# Patient Record
Sex: Female | Born: 1960 | Race: White | Hispanic: No | Smoking: Never smoker
Health system: Southern US, Community
[De-identification: ages and names within clinical notes are randomized; demographics above are authoritative.]

## PROBLEM LIST (undated history)

## (undated) DIAGNOSIS — F419 Anxiety disorder, unspecified: Secondary | ICD-10-CM

## (undated) DIAGNOSIS — K219 Gastro-esophageal reflux disease without esophagitis: Secondary | ICD-10-CM

## (undated) DIAGNOSIS — K2270 Barrett's esophagus without dysplasia: Secondary | ICD-10-CM

## (undated) DIAGNOSIS — G8929 Other chronic pain: Secondary | ICD-10-CM

## (undated) DIAGNOSIS — M1990 Unspecified osteoarthritis, unspecified site: Secondary | ICD-10-CM

## (undated) DIAGNOSIS — E7800 Pure hypercholesterolemia, unspecified: Secondary | ICD-10-CM

## (undated) DIAGNOSIS — N390 Urinary tract infection, site not specified: Secondary | ICD-10-CM

## (undated) DIAGNOSIS — K589 Irritable bowel syndrome without diarrhea: Secondary | ICD-10-CM

## (undated) DIAGNOSIS — F32A Depression, unspecified: Secondary | ICD-10-CM

## (undated) DIAGNOSIS — E079 Disorder of thyroid, unspecified: Secondary | ICD-10-CM

## (undated) HISTORY — DX: Barrett's esophagus without dysplasia: K22.70

## (undated) HISTORY — DX: Disorder of thyroid, unspecified: E07.9

## (undated) HISTORY — DX: Depression, unspecified: F32.A

## (undated) HISTORY — DX: Other chronic pain: G89.29

## (undated) HISTORY — DX: Pure hypercholesterolemia, unspecified: E78.00

## (undated) HISTORY — DX: Urinary tract infection, site not specified: N39.0

## (undated) HISTORY — DX: Gastro-esophageal reflux disease without esophagitis: K21.9

## (undated) HISTORY — DX: Unspecified osteoarthritis, unspecified site: M19.90

## (undated) HISTORY — PX: TONSILLECTOMY: SUR1361

## (undated) HISTORY — DX: Anxiety disorder, unspecified: F41.9

## (undated) HISTORY — DX: Irritable bowel syndrome, unspecified: K58.9

---

## 1998-05-11 ENCOUNTER — Other Ambulatory Visit: Admission: RE | Admit: 1998-05-11 | Discharge: 1998-05-11 | Payer: Self-pay | Admitting: Obstetrics and Gynecology

## 1998-10-05 ENCOUNTER — Other Ambulatory Visit: Admission: RE | Admit: 1998-10-05 | Discharge: 1998-10-05 | Payer: Self-pay | Admitting: Obstetrics and Gynecology

## 2004-05-04 ENCOUNTER — Other Ambulatory Visit: Admission: RE | Admit: 2004-05-04 | Discharge: 2004-05-04 | Payer: Self-pay | Admitting: Obstetrics and Gynecology

## 2004-05-16 ENCOUNTER — Ambulatory Visit (HOSPITAL_COMMUNITY): Admission: RE | Admit: 2004-05-16 | Discharge: 2004-05-16 | Payer: Self-pay | Admitting: Obstetrics and Gynecology

## 2004-05-26 ENCOUNTER — Encounter: Admission: RE | Admit: 2004-05-26 | Discharge: 2004-05-26 | Payer: Self-pay | Admitting: Obstetrics and Gynecology

## 2005-02-05 ENCOUNTER — Emergency Department (HOSPITAL_COMMUNITY): Admission: EM | Admit: 2005-02-05 | Discharge: 2005-02-05 | Payer: Self-pay | Admitting: Emergency Medicine

## 2005-05-17 ENCOUNTER — Encounter: Admission: RE | Admit: 2005-05-17 | Discharge: 2005-05-17 | Payer: Self-pay | Admitting: Obstetrics and Gynecology

## 2005-05-24 ENCOUNTER — Other Ambulatory Visit: Admission: RE | Admit: 2005-05-24 | Discharge: 2005-05-24 | Payer: Self-pay | Admitting: Obstetrics and Gynecology

## 2005-10-31 ENCOUNTER — Other Ambulatory Visit: Admission: RE | Admit: 2005-10-31 | Discharge: 2005-10-31 | Payer: Self-pay | Admitting: Obstetrics and Gynecology

## 2006-01-17 ENCOUNTER — Ambulatory Visit (HOSPITAL_COMMUNITY): Admission: RE | Admit: 2006-01-17 | Discharge: 2006-01-17 | Payer: Self-pay | Admitting: Family Medicine

## 2006-01-23 ENCOUNTER — Ambulatory Visit (HOSPITAL_COMMUNITY): Admission: RE | Admit: 2006-01-23 | Discharge: 2006-01-23 | Payer: Self-pay | Admitting: Family Medicine

## 2006-02-13 ENCOUNTER — Other Ambulatory Visit: Admission: RE | Admit: 2006-02-13 | Discharge: 2006-02-13 | Payer: Self-pay | Admitting: Obstetrics and Gynecology

## 2006-05-20 ENCOUNTER — Encounter: Admission: RE | Admit: 2006-05-20 | Discharge: 2006-05-20 | Payer: Self-pay | Admitting: Obstetrics & Gynecology

## 2006-07-08 ENCOUNTER — Encounter (INDEPENDENT_AMBULATORY_CARE_PROVIDER_SITE_OTHER): Payer: Self-pay | Admitting: Specialist

## 2006-07-08 ENCOUNTER — Ambulatory Visit (HOSPITAL_COMMUNITY): Admission: RE | Admit: 2006-07-08 | Discharge: 2006-07-08 | Payer: Self-pay | Admitting: Obstetrics & Gynecology

## 2006-08-28 ENCOUNTER — Encounter: Admission: RE | Admit: 2006-08-28 | Discharge: 2006-08-28 | Payer: Self-pay | Admitting: Geriatric Medicine

## 2007-05-23 ENCOUNTER — Encounter: Admission: RE | Admit: 2007-05-23 | Discharge: 2007-05-23 | Payer: Self-pay | Admitting: Obstetrics & Gynecology

## 2007-09-09 ENCOUNTER — Encounter: Admission: RE | Admit: 2007-09-09 | Discharge: 2007-09-09 | Payer: Self-pay | Admitting: Geriatric Medicine

## 2007-09-29 ENCOUNTER — Encounter: Admission: RE | Admit: 2007-09-29 | Discharge: 2007-09-29 | Payer: Self-pay | Admitting: Urology

## 2008-01-09 ENCOUNTER — Encounter (INDEPENDENT_AMBULATORY_CARE_PROVIDER_SITE_OTHER): Payer: Self-pay | Admitting: Obstetrics & Gynecology

## 2008-01-09 ENCOUNTER — Ambulatory Visit (HOSPITAL_COMMUNITY): Admission: RE | Admit: 2008-01-09 | Discharge: 2008-01-09 | Payer: Self-pay | Admitting: Obstetrics & Gynecology

## 2008-06-08 ENCOUNTER — Encounter: Admission: RE | Admit: 2008-06-08 | Discharge: 2008-06-08 | Payer: Self-pay | Admitting: Obstetrics & Gynecology

## 2008-11-20 ENCOUNTER — Encounter: Admission: RE | Admit: 2008-11-20 | Discharge: 2008-11-20 | Payer: Self-pay | Admitting: Geriatric Medicine

## 2009-04-26 ENCOUNTER — Encounter: Admission: RE | Admit: 2009-04-26 | Discharge: 2009-04-26 | Payer: Self-pay | Admitting: Geriatric Medicine

## 2009-05-04 ENCOUNTER — Encounter: Admission: RE | Admit: 2009-05-04 | Discharge: 2009-05-04 | Payer: Self-pay | Admitting: Geriatric Medicine

## 2009-06-13 ENCOUNTER — Encounter: Admission: RE | Admit: 2009-06-13 | Discharge: 2009-06-13 | Payer: Self-pay | Admitting: Obstetrics & Gynecology

## 2010-04-16 ENCOUNTER — Encounter: Payer: Self-pay | Admitting: Obstetrics and Gynecology

## 2010-05-22 ENCOUNTER — Other Ambulatory Visit: Payer: Self-pay | Admitting: Obstetrics & Gynecology

## 2010-05-22 DIAGNOSIS — Z1231 Encounter for screening mammogram for malignant neoplasm of breast: Secondary | ICD-10-CM

## 2010-06-23 ENCOUNTER — Ambulatory Visit: Payer: Self-pay

## 2010-06-23 ENCOUNTER — Ambulatory Visit
Admission: RE | Admit: 2010-06-23 | Discharge: 2010-06-23 | Disposition: A | Discharge status: Home / Self Care | Payer: BC Managed Care – PPO | Source: Ambulatory Visit | Attending: Obstetrics & Gynecology | Admitting: Obstetrics & Gynecology

## 2010-06-23 DIAGNOSIS — Z1231 Encounter for screening mammogram for malignant neoplasm of breast: Secondary | ICD-10-CM

## 2010-08-08 NOTE — Op Note (Signed)
NAME:  Nicole Davila, Nicole Davila                  ACCOUNT NO.:  [id number]   MEDICAL RECORD NO.:  [id number]          PATIENT TYPE:  AMB   LOCATION:  SDC                           FACILITY:  [facility]   PHYSICIAN:  Genia Del, M.D.DATE OF BIRTH:  [birth year]   DATE OF PROCEDURE:  01/09/2008  DATE OF DISCHARGE:                               OPERATIVE REPORT   The patient came to [facility] on January 09, 2008, for surgery.   PREOPERATIVE DIAGNOSIS:  Intrauterine lesion with menometrorrhagia.   POSTOPERATIVE DIAGNOSIS:  Intrauterine lesion with menometrorrhagia,  probable endometrial polyp.   PROCEDURE:  Hysteroscopy resection of polyp and dilatation and  curettage.   SURGEON:  Genia Del, MD   ANESTHESIOLOGIST:  Raul Del, MD   PROCEDURE:  Under general anesthesia with endotracheal intubation, the  patient is in lithotomy position.  She was prepped with Betadine on the  suprapubic vulvar and vaginal areas and draped as usual.  The exam under  general anesthesia revealed an anteverted uterus, normal volume, mobile,  and no adnexal mass.  The speculum was introduced in the vagina.  The  anterior lip of the cervix was grasped with a tenaculum.  We proceeded  with dilatation of the cervix with Hegar dilators up to #35 without  difficulty.  We then insert the operative hysteroscope inside the  intrauterine cavity.  A large polyp was visible on the anterior wall of  the intrauterine cavity slightly to the right.  Pictures were taken.  We  then used the loop and cutting mode to resect the endometrial polyp.  The specimen was sent separately to pathology.  We then proceed with a  systematic curettage of the intrauterine cavity on all surfaces.  The  endometrium was thick.  A large specimen was sent to pathology.  We then  take pictures after showing normal intrauterine cavity.  No lesion  visible.  The left ostium was well identified and the picture was taken,  on the right it was  seen, but no picture could be taken as the  endometrium was coming in the view and prevented a good picture.  Hemostasis was adequate.  The instruments were removed.  A paracervical  block was done with Nesacaine 1% 20 mL at 4 and 8 o'clock.  Hemostasis  was good on the cervix.  The speculum was removed.  The estimated blood  loss was minimal.  No complications occurred and the patient was brought  to recovery room in good stable status.      Genia Del, M.D.  Electronically Signed     ML/MEDQ  D:  01/09/2008  T:  01/09/2008  Job:  [id number]

## 2010-08-11 NOTE — Op Note (Signed)
NAME:  Nicole Davila, Nicole Davila                  ACCOUNT NO.:  [id number]   MEDICAL RECORD NO.:  [id number]          PATIENT TYPE:  AMB   LOCATION:  SDC                           [facility]ACILITY:  [facility]   PHYSICIAN:  Genia Del, M.D.DATE OF BIRTH:  [birth year]   DATE OF PROCEDURE:  07/08/2006  DATE OF DISCHARGE:                               OPERATIVE REPORT   PREOPERATIVE DIAGNOSIS:  Metrorrhagia with probable endometrial polyp.   POSTOPERATIVE DIAGNOSIS:  Metrorrhagia with probable endometrial polyp.  No polyp visualized.   PROCEDURE:  Diagnostic hysteroscopy with D&C.   SURGEON:  Dr. Genia Del   ASSISTANT:  None.   ANESTHESIOLOGIST:  Dr. Cristela Blue   DESCRIPTION OF PROCEDURE:  Under general anesthesia with endotracheal  intubation the patient is in lithotomy position.  She is prepped with  Betadine on the suprapubic, vulvar and vaginal areas.  The bladder is  catheterized and the patient is draped as usual.  The vaginal exam  revealed an anteverted uterus, normal volume, mobile, no adnexal mass.  The speculum is introduced in the vagina.  The anterior lip of the  cervix was grasped with a tenaculum.  The inferior vitreous 7 cm.  We  dilate the cervix up to Hegar dilator #31 without difficulty.  The  diagnostic hysteroscope was introduced into the intrauterine cavity.  The cavity appears normal to inspection.  The endometrium was slightly  thick but no clear endometrial polyp was visualized. The decision was  therefore taken to proceed with curettage.  A sharp curette is used and  the intrauterine cavity is curetted systematically on all surfaces.  The  endometrial curettings are sent to pathology.  Pictures were taken of  the intrauterine cavity before the curettage.  Hemostasis is adequate.  All instruments are removed. The estimated blood loss was minimal.  The  fluid deficit was low.  No complications occurred and the patient was  brought to recovery room in good stable  status.      Genia Del, M.D.  Electronically Signed     ML/MEDQ  D:  07/08/2006  T:  07/09/2006  Job:  [id number]

## 2010-09-28 ENCOUNTER — Other Ambulatory Visit: Payer: Self-pay | Admitting: Gastroenterology

## 2010-09-29 ENCOUNTER — Ambulatory Visit
Admission: RE | Admit: 2010-09-29 | Discharge: 2010-09-29 | Disposition: A | Discharge status: Home / Self Care | Payer: BC Managed Care – PPO | Source: Ambulatory Visit | Attending: Gastroenterology | Admitting: Gastroenterology

## 2010-12-25 LAB — CBC
HCT: 41.4
Hemoglobin: 13.6
MCHC: 32.8
MCV: 92.2
Platelets: 399
RBC: 4.49
RDW: 13.3
WBC: 9

## 2010-12-25 LAB — PREGNANCY, URINE: Preg Test, Ur: NEGATIVE

## 2011-01-09 ENCOUNTER — Other Ambulatory Visit: Payer: Self-pay | Admitting: Geriatric Medicine

## 2011-01-09 DIAGNOSIS — R609 Edema, unspecified: Secondary | ICD-10-CM

## 2011-01-11 ENCOUNTER — Ambulatory Visit
Admission: RE | Admit: 2011-01-11 | Discharge: 2011-01-11 | Disposition: A | Discharge status: Home / Self Care | Payer: BC Managed Care – PPO | Source: Ambulatory Visit | Attending: Geriatric Medicine | Admitting: Geriatric Medicine

## 2011-01-11 DIAGNOSIS — R609 Edema, unspecified: Secondary | ICD-10-CM

## 2011-05-24 ENCOUNTER — Other Ambulatory Visit: Payer: Self-pay | Admitting: Obstetrics & Gynecology

## 2011-05-24 DIAGNOSIS — Z1231 Encounter for screening mammogram for malignant neoplasm of breast: Secondary | ICD-10-CM

## 2011-06-28 ENCOUNTER — Ambulatory Visit
Admission: RE | Admit: 2011-06-28 | Discharge: 2011-06-28 | Disposition: A | Discharge status: Home / Self Care | Payer: BC Managed Care – PPO | Source: Ambulatory Visit | Attending: Obstetrics & Gynecology | Admitting: Obstetrics & Gynecology

## 2011-06-28 DIAGNOSIS — Z1231 Encounter for screening mammogram for malignant neoplasm of breast: Secondary | ICD-10-CM

## 2012-05-27 ENCOUNTER — Other Ambulatory Visit: Payer: Self-pay

## 2012-06-30 ENCOUNTER — Ambulatory Visit: Payer: BC Managed Care – PPO

## 2012-07-08 ENCOUNTER — Ambulatory Visit
Admission: RE | Admit: 2012-07-08 | Discharge: 2012-07-08 | Disposition: A | Discharge status: Home / Self Care | Payer: BC Managed Care – PPO | Source: Ambulatory Visit

## 2012-07-08 DIAGNOSIS — Z1231 Encounter for screening mammogram for malignant neoplasm of breast: Secondary | ICD-10-CM

## 2012-11-15 ENCOUNTER — Emergency Department (HOSPITAL_BASED_OUTPATIENT_CLINIC_OR_DEPARTMENT_OTHER)
Admission: EM | Admit: 2012-11-15 | Discharge: 2012-11-15 | Disposition: A | Discharge status: Home / Self Care | Payer: BC Managed Care – PPO | Attending: Emergency Medicine | Admitting: Emergency Medicine

## 2012-11-15 ENCOUNTER — Encounter (HOSPITAL_BASED_OUTPATIENT_CLINIC_OR_DEPARTMENT_OTHER): Payer: Self-pay

## 2012-11-15 DIAGNOSIS — Y929 Unspecified place or not applicable: Secondary | ICD-10-CM | POA: Insufficient documentation

## 2012-11-15 DIAGNOSIS — W540XXA Bitten by dog, initial encounter: Secondary | ICD-10-CM | POA: Insufficient documentation

## 2012-11-15 DIAGNOSIS — Z23 Encounter for immunization: Secondary | ICD-10-CM | POA: Insufficient documentation

## 2012-11-15 DIAGNOSIS — Y939 Activity, unspecified: Secondary | ICD-10-CM | POA: Insufficient documentation

## 2012-11-15 DIAGNOSIS — Z79899 Other long term (current) drug therapy: Secondary | ICD-10-CM | POA: Insufficient documentation

## 2012-11-15 DIAGNOSIS — S71109A Unspecified open wound, unspecified thigh, initial encounter: Secondary | ICD-10-CM | POA: Insufficient documentation

## 2012-11-15 DIAGNOSIS — S71009A Unspecified open wound, unspecified hip, initial encounter: Secondary | ICD-10-CM | POA: Insufficient documentation

## 2012-11-15 MED ORDER — AMOXICILLIN-POT CLAVULANATE 875-125 MG PO TABS: 1.0000 | ORAL_TABLET | Freq: Two times a day (BID) | ORAL | Status: DC

## 2012-11-15 MED ORDER — TETANUS-DIPHTH-ACELL PERTUSSIS 5-2.5-18.5 LF-MCG/0.5 IM SUSP
0.5000 mL | Freq: Once | INTRAMUSCULAR | Status: AC
  Administered 2012-11-15: 0.5 mL via INTRAMUSCULAR
  Filled 2012-11-15: qty 0.5

## 2012-11-15 NOTE — ED Notes (Signed)
Patient here with dogbite x 2 this pm. Bite to right knee and right hip. Puncture wound x 4 to knee, and puncture x 3 to right hip. No bleeding but swelling noted to both. Neighbors dog and unsure of rabies status

## 2012-11-15 NOTE — ED Provider Notes (Signed)
Medical screening examination/treatment/procedure(s) were performed by non-physician practitioner and as supervising physician I was immediately available for consultation/collaboration.   Canton Yearby M Rebecca Cairns, MD 11/15/12 2219 

## 2012-11-15 NOTE — ED Notes (Signed)
Pt was bitten by a white dog while going to a friends house, the dog belonged to the friend's neighbor. Dogs rabies vaccination was reported to be up to date - vaccination dated for 12/2009. Pt sustained x2 puncture wounds to rt lateral thigh w/ surrounding contusion and x2 medial rt knee. Bleeding minimal, clean dressing applied. Pt unsure of last tetanus shot. Pt in no acute distress, anxious, A&Ox4

## 2012-11-15 NOTE — ED Notes (Signed)
Per nurse request, I completed wound care. Bleeding stopped.

## 2012-11-15 NOTE — ED Provider Notes (Signed)
CSN: [id number]     Arrival date & time 11/15/12  1921 History     First MD Initiated Contact with Patient 11/15/12 2043     Chief Complaint  Patient presents with  . Animal Bite   (Consider location/radiation/quality/duration/timing/severity/associated sxs/prior Treatment) HPI Comments: Patient is a 52 year old female who presents to the ED after being bit by a dog prior to arrival. Patient reports an unprovoked attack by the neighbor's dog. Patient thinks the dog was either a boxer or a pitbull. The dog's last rabies vaccination was 3 years ago. Patient was bit on the right hip and right knee. Patient reports throbbing and moderate pain without radiation. Patient denies any other injury. Patient reports some associated bleeding. No aggravating/alleviating factors.   Patient is a 52 y.o. female presenting with animal bite.  Animal Bite   History reviewed. No pertinent past medical history. History reviewed. No pertinent past surgical history. No family history on file. History  Substance Use Topics  . Smoking status: Never Smoker   . Smokeless tobacco: Not on file  . Alcohol Use: Not on file   OB History   Grav Para Term Preterm Abortions TAB SAB Ect Mult Living                 Review of Systems  Skin: Positive for wound.  All other systems reviewed and are negative.    Allergies  Review of patient's allergies indicates no known allergies.  Home Medications   Current Outpatient Rx  Name  Route  Sig  Dispense  Refill  . levothyroxine (SYNTHROID, LEVOTHROID) 100 MCG tablet   Oral   Take 100 mcg by mouth daily before breakfast.         . lithium 600 MG capsule   Oral   Take 600 mg by mouth 1 day or 1 dose.          BP 128/80  Pulse 76  Temp(Src) 98.1 F (36.7 C) (Oral)  Resp 16  Wt 132 lb (59.875 kg)  SpO2 100% Physical Exam  Nursing note and vitals reviewed. Constitutional: She is oriented to person, place, and time. She appears well-developed and  well-nourished. No distress.  HENT:  Head: Normocephalic and atraumatic.  Eyes: Conjunctivae and EOM are normal.  Neck: Normal range of motion.  Cardiovascular: Normal rate and regular rhythm.  Exam reveals no gallop and no friction rub.   No murmur heard. Pulmonary/Chest: Effort normal and breath sounds normal. She has no wheezes. She has no rales. She exhibits no tenderness.  Abdominal: Soft. She exhibits no distension. There is no tenderness. There is no rebound and no guarding.  Musculoskeletal: Normal range of motion.  Neurological: She is alert and oriented to person, place, and time. Coordination normal.  Speech is goal-oriented. Moves limbs without ataxia.   Skin: Skin is warm and dry.  3 puncture wounds noted to right lateral hip. 4 puncture wounds noted to right knee. Bleeding controlled.   Psychiatric: She has a normal mood and affect. Her behavior is normal.    ED Course   Procedures (including critical care time)  Labs Reviewed - No data to display No results found. 1. Dog bite, initial encounter     MDM  9:53 PM Vitals stable and patient afebrile. Wounds cleaned. The dog was last vaccinated in 2011. Patient will have Augmentin and instructed to follow up with the dog's rabies vaccination status. Patient instructed to return with worsening or concerning symptoms.   Patient  given tetanus shot here.   Emilia Beck, PA-C 11/15/12 2206  Emilia Beck, PA-C 11/15/12 2215

## 2012-11-15 NOTE — ED Notes (Signed)
Pt ambulating independently w/ steady gait on d/c in no acute distress, A&Ox4. D/c instructions reviewed w/ pt and family - pt and family deny any further questions or concerns at present. Rx given x1  

## 2012-11-15 NOTE — ED Notes (Signed)
Saline dressing to all punctures, no bleeding

## 2013-01-30 ENCOUNTER — Other Ambulatory Visit: Payer: Self-pay | Admitting: Geriatric Medicine

## 2013-01-30 DIAGNOSIS — E039 Hypothyroidism, unspecified: Secondary | ICD-10-CM

## 2013-02-03 ENCOUNTER — Ambulatory Visit
Admission: RE | Admit: 2013-02-03 | Discharge: 2013-02-03 | Disposition: A | Discharge status: Home / Self Care | Payer: BC Managed Care – PPO | Source: Ambulatory Visit | Attending: Geriatric Medicine | Admitting: Geriatric Medicine

## 2013-02-03 DIAGNOSIS — E039 Hypothyroidism, unspecified: Secondary | ICD-10-CM

## 2013-07-31 ENCOUNTER — Other Ambulatory Visit: Payer: Self-pay

## 2013-07-31 DIAGNOSIS — Z1231 Encounter for screening mammogram for malignant neoplasm of breast: Secondary | ICD-10-CM

## 2013-08-03 ENCOUNTER — Ambulatory Visit
Admission: RE | Admit: 2013-08-03 | Discharge: 2013-08-03 | Disposition: A | Discharge status: Home / Self Care | Payer: BC Managed Care – PPO | Source: Ambulatory Visit

## 2013-08-03 ENCOUNTER — Encounter (INDEPENDENT_AMBULATORY_CARE_PROVIDER_SITE_OTHER): Payer: Self-pay

## 2013-08-03 DIAGNOSIS — Z1231 Encounter for screening mammogram for malignant neoplasm of breast: Secondary | ICD-10-CM

## 2014-07-30 ENCOUNTER — Other Ambulatory Visit: Payer: Self-pay

## 2014-07-30 DIAGNOSIS — Z1231 Encounter for screening mammogram for malignant neoplasm of breast: Secondary | ICD-10-CM

## 2014-08-19 ENCOUNTER — Ambulatory Visit: Payer: BC Managed Care – PPO

## 2014-09-07 ENCOUNTER — Other Ambulatory Visit: Payer: Self-pay | Admitting: Obstetrics & Gynecology

## 2014-09-10 ENCOUNTER — Other Ambulatory Visit: Payer: Self-pay | Admitting: Obstetrics & Gynecology

## 2014-09-10 DIAGNOSIS — Z780 Asymptomatic menopausal state: Secondary | ICD-10-CM

## 2014-09-14 ENCOUNTER — Ambulatory Visit
Admission: RE | Admit: 2014-09-14 | Discharge: 2014-09-14 | Disposition: A | Discharge status: Home / Self Care | Payer: BC Managed Care – PPO | Source: Ambulatory Visit

## 2014-09-14 ENCOUNTER — Ambulatory Visit
Admission: RE | Admit: 2014-09-14 | Discharge: 2014-09-14 | Disposition: A | Discharge status: Home / Self Care | Payer: BC Managed Care – PPO | Source: Ambulatory Visit | Attending: Obstetrics & Gynecology | Admitting: Obstetrics & Gynecology

## 2014-09-14 DIAGNOSIS — Z1231 Encounter for screening mammogram for malignant neoplasm of breast: Secondary | ICD-10-CM

## 2014-09-14 DIAGNOSIS — Z780 Asymptomatic menopausal state: Secondary | ICD-10-CM

## 2015-01-13 ENCOUNTER — Encounter: Payer: Self-pay | Admitting: Neurology

## 2015-01-24 ENCOUNTER — Encounter: Payer: Self-pay | Admitting: Neurology

## 2015-03-29 ENCOUNTER — Ambulatory Visit (INDEPENDENT_AMBULATORY_CARE_PROVIDER_SITE_OTHER): Payer: Self-pay | Admitting: Neurology

## 2015-03-29 ENCOUNTER — Ambulatory Visit (INDEPENDENT_AMBULATORY_CARE_PROVIDER_SITE_OTHER): Payer: BC Managed Care – PPO | Admitting: Neurology

## 2015-03-29 DIAGNOSIS — R202 Paresthesia of skin: Secondary | ICD-10-CM | POA: Diagnosis not present

## 2015-03-29 DIAGNOSIS — Z0289 Encounter for other administrative examinations: Secondary | ICD-10-CM

## 2015-03-29 NOTE — Procedures (Signed)
  [facility]    Provider:  Dr Lucia GaskinsAhern Referring Provider: Merlene LaughterStoneking, Hal, MD Primary Care Physician:  Ginette OttoSTONEKING,HAL THOMAS, MD  CC:  Paresthesias  HPI:  Nicole Davila is a 55 y.o. female here as a referral from Dr. Pete GlatterStoneking for paresthesias in the feet. She reports tingling and pain in the bottom of the feet. She also has muscle cramps in the feet. Left is worse than the right.   Summary  Nerve conduction studies were performed on the bilateral lower extremities:  The bilateral Peroneal motor nerves showed normal conductions with normal F Wave latency The bilateral Tibial motor nerves showed normal conductions with normal F Wave latency The bilateral Superficial Peroneal sensory nerves were within normal limits The bilateral Sural sensory nerves were within normal limits Bilateral H Reflexes showed normal latencies  EMG Needle study was performed on selected bilateral lower extremity muscles:   The  Vastus Medialis, Gluteus Medius and Maximus, Biceps Femoris(long head), Anterior Tibialis, Medial Gastrocnemius, Extensor Hallucis Longus, Abductor Hallucis muscles and L5/S1 paraspinals were within normal limits bilaterally.   Conclusion: This is a normal study. No electrophysiologic evidence for  large-fiber peripheral polyneuropathy or lumbar radiculopathy. However patient's symptoms could be explained by a small-fiber neuropathy and still evade detection by this exam.Clinical correlation recommended.   Naomie DeanAntonia Ahern, MD  [facility] [address] [place], [place] [id number]  Phone [phone] Fax [phone]

## 2015-03-29 NOTE — Progress Notes (Signed)
See procedure note.

## 2015-03-29 NOTE — Progress Notes (Signed)
  [facility]    Provider:  Dr Ahern Referring Provider: Stoneking, Hal, MD Primary Care Physician:  STONEKING,HAL THOMAS, MD  CC:  Paresthesias  HPI:  Nicole Davila is a 54 y.o. female here as a referral from Dr. Stoneking for paresthesias in the feet. She reports tingling and pain in the bottom of the feet. She also has muscle cramps in the feet. Left is worse than the right.   Summary  Nerve conduction studies were performed on the bilateral lower extremities:  The bilateral Peroneal motor nerves showed normal conductions with normal F Wave latency The bilateral Tibial motor nerves showed normal conductions with normal F Wave latency The bilateral Superficial Peroneal sensory nerves were within normal limits The bilateral Sural sensory nerves were within normal limits Bilateral H Reflexes showed normal latencies  EMG Needle study was performed on selected bilateral lower extremity muscles:   The  Vastus Medialis, Gluteus Medius and Maximus, Biceps Femoris(long head), Anterior Tibialis, Medial Gastrocnemius, Extensor Hallucis Longus, Abductor Hallucis muscles and L5/S1 paraspinals were within normal limits bilaterally.   Conclusion: This is a normal study. No electrophysiologic evidence for  large-fiber peripheral polyneuropathy or lumbar radiculopathy. However patient's symptoms could be explained by a small-fiber neuropathy and still evade detection by this exam.Clinical correlation recommended.   Antonia Ahern, MD  [facility] [address] [place], [place] [address]  Phone [phone] Fax [phone] 

## 2015-08-26 ENCOUNTER — Other Ambulatory Visit: Payer: Self-pay | Admitting: Obstetrics & Gynecology

## 2015-08-26 DIAGNOSIS — Z1231 Encounter for screening mammogram for malignant neoplasm of breast: Secondary | ICD-10-CM

## 2015-09-16 ENCOUNTER — Ambulatory Visit
Admission: RE | Admit: 2015-09-16 | Discharge: 2015-09-16 | Disposition: A | Discharge status: Home / Self Care | Payer: BC Managed Care – PPO | Source: Ambulatory Visit | Attending: Obstetrics & Gynecology | Admitting: Obstetrics & Gynecology

## 2015-09-16 DIAGNOSIS — Z1231 Encounter for screening mammogram for malignant neoplasm of breast: Secondary | ICD-10-CM

## 2016-02-22 ENCOUNTER — Other Ambulatory Visit: Payer: Self-pay | Admitting: Geriatric Medicine

## 2016-02-22 DIAGNOSIS — E010 Iodine-deficiency related diffuse (endemic) goiter: Secondary | ICD-10-CM

## 2016-02-28 ENCOUNTER — Ambulatory Visit
Admission: RE | Admit: 2016-02-28 | Discharge: 2016-02-28 | Disposition: A | Discharge status: Home / Self Care | Payer: BC Managed Care – PPO | Source: Ambulatory Visit | Attending: Geriatric Medicine | Admitting: Geriatric Medicine

## 2016-02-28 DIAGNOSIS — E010 Iodine-deficiency related diffuse (endemic) goiter: Secondary | ICD-10-CM

## 2016-04-19 ENCOUNTER — Other Ambulatory Visit: Payer: Self-pay | Admitting: Geriatric Medicine

## 2016-04-19 DIAGNOSIS — R110 Nausea: Secondary | ICD-10-CM

## 2016-04-19 DIAGNOSIS — R109 Unspecified abdominal pain: Secondary | ICD-10-CM

## 2016-04-20 ENCOUNTER — Ambulatory Visit
Admission: RE | Admit: 2016-04-20 | Discharge: 2016-04-20 | Disposition: A | Discharge status: Home / Self Care | Payer: BC Managed Care – PPO | Source: Ambulatory Visit | Attending: Geriatric Medicine | Admitting: Geriatric Medicine

## 2016-04-20 DIAGNOSIS — R109 Unspecified abdominal pain: Secondary | ICD-10-CM

## 2016-04-20 DIAGNOSIS — R110 Nausea: Secondary | ICD-10-CM

## 2016-05-04 ENCOUNTER — Other Ambulatory Visit: Payer: Self-pay | Admitting: Gastroenterology

## 2016-05-15 ENCOUNTER — Ambulatory Visit: Payer: BC Managed Care – PPO | Admitting: Sports Medicine

## 2016-07-05 ENCOUNTER — Encounter: Payer: Self-pay | Admitting: Sports Medicine

## 2016-07-05 ENCOUNTER — Ambulatory Visit (INDEPENDENT_AMBULATORY_CARE_PROVIDER_SITE_OTHER): Payer: BC Managed Care – PPO | Admitting: Sports Medicine

## 2016-07-05 DIAGNOSIS — M25579 Pain in unspecified ankle and joints of unspecified foot: Secondary | ICD-10-CM | POA: Insufficient documentation

## 2016-07-05 DIAGNOSIS — M79671 Pain in right foot: Secondary | ICD-10-CM

## 2016-07-05 DIAGNOSIS — M7741 Metatarsalgia, right foot: Secondary | ICD-10-CM | POA: Diagnosis not present

## 2016-07-05 DIAGNOSIS — M79672 Pain in left foot: Secondary | ICD-10-CM | POA: Diagnosis not present

## 2016-07-05 DIAGNOSIS — R269 Unspecified abnormalities of gait and mobility: Secondary | ICD-10-CM | POA: Diagnosis not present

## 2016-07-05 NOTE — Patient Instructions (Signed)
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

## 2016-07-05 NOTE — Assessment & Plan Note (Signed)
This seems related to structural breakdown  Likely that the RT foot had increased intrauterine pressure against dorsum and she never full developed MT 2 and 3 - has unilateral brachymetatarsia  This will require ongoing orthotic support  Pedal spasm is very likely related to inadequate hydration when she stands too much particularly with her taking Lithium

## 2016-07-05 NOTE — Assessment & Plan Note (Signed)
No abnormal calluses  We stuck with extra small MT pads to support these  Those seemed to be tolerated

## 2016-07-05 NOTE — Progress Notes (Signed)
CC: Bilat foot pain  Referred courtesy of Dr Merlene Laughter  Patient has a history of foot pain that started in childhood Work "special shoes" as toddler Years ago had Morton's neuroma pain on left foot That gradually resolved with wider shoes  During pregnancy shoe size increased by 1 size  For past 10 years particularly after working or standing too much she gets diffuse foot pain Not over heels Extends down long arch bilat Now has increase pain and sensitivity at 1st MTP joint bilat Stiff in mornings Aching pain at night  Periodic pedal spasm with cramp in arch  Past Hx Bipolar disorder Lithium has helped and levels are good Hypothyroid  Document reviewed - full medical Hx from [facility]  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 Range of motion is full in all directions. Strength is 5/5 in all directions. Stable lateral and medial ligaments; squeeze test and kleiger test unremarkable; Talar dome nontender; No pain at base of 5th MT; No tenderness over cuboid; No tenderness over N spot or navicular prominence No tenderness on posterior aspects of lateral and medial malleolus No sign of peroneal tendon subluxations; Negative tarsal tunnel tinel's Able to walk 4 steps.  RT foot is pronated This starts more at midfoot as no real calcaneal valgus Splaying toes 1 and 2 Brachymetatarsia of toes 2 and 3 Loss of long and transverse arches No focal TTP Mild restriction of flexion MTP 1  LT foot Loss of long and transverse arch No abn calluses Mod. pronation Valgus of 15 deg at MTP 1 No focal TTP Normal metatarsal length  Gait is pronated RT > LT

## 2016-07-05 NOTE — Assessment & Plan Note (Signed)
Sports insoles Added scaphoid pads  This corrected all of pronation on left and about 90% on RT  Felt more comfortable for her  HEP for arch strength  Ultimately may convert to custom orthotics

## 2016-08-21 ENCOUNTER — Ambulatory Visit (INDEPENDENT_AMBULATORY_CARE_PROVIDER_SITE_OTHER): Payer: BC Managed Care – PPO | Admitting: Sports Medicine

## 2016-08-21 ENCOUNTER — Encounter: Payer: Self-pay | Admitting: Sports Medicine

## 2016-08-21 VITALS — BP 124/64 | Ht 65.0 in | Wt 143.0 lb

## 2016-08-21 DIAGNOSIS — R269 Unspecified abnormalities of gait and mobility: Secondary | ICD-10-CM

## 2016-08-21 NOTE — Progress Notes (Signed)
  Nicole Davila - 56 y.o. female MRN [id number]  Date of birth: Dec 06, 1960  SUBJECTIVE:  Including CC & ROS.    Nicole Davila is a 56 year old female that is presenting with bilateral foot pain. She has tried greens fore insoles with a metatarsal pad on each as well as a scaphoid pad. These improved her pain but she did have discomfort with standing still one spot. The pain is occurring on the dorsal aspect of each foot when she does have a. She also has occasional pain in the first MTP joint of each foot.  ROS: No unexpected weight loss, fever, chills, swelling, instability, muscle pain, numbness/tingling, redness, otherwise see HPI   HISTORY: Past Medical, Surgical, Social, and Family History Reviewed & Updated per EMR.   Pertinent Historical Findings include: PMHx: Bipolar, hypothyroidism  Social:  Works as a Runner, broadcasting/film/videoteacher   DATA REVIEWED: none  PHYSICAL EXAM:  VS: BP 124/64   Ht 5\' 5"  (1.651 m)   Wt 143 lb (64.9 kg)   BMI 23.80 kg/m  PHYSICAL EXAM: Gen: NAD, alert, cooperative with exam, well-appearing HEENT: clear conjunctiva, EOMI CV:  no edema, capillary refill brisk,  Resp: non-labored, normal speech Skin: no rashes, normal turgor  Neuro: no gross deficits.  Psych:  alert and oriented MSK:  Feet:  Right foot is pronated. Splaying of toes 1 and 2. Brachymetatarsia of toes 2 and 3. Loss of long and transverse arch bilaterally. Gait: Tends to pronate on the right MTP joint  ASSESSMENT & PLAN:   Abnormality of gait She had some relief with the greens fore insoles that she was fitted for custom orthotics today. A first ray post was placed on the right foot may need this on the left foot as well. She will drop these off if there are any adjustments that need to be made.  Patient was fitted for a standard, cushioned, semi-rigid orthotic. The orthotic was heated and afterward the patient stood on the orthotic blank positioned on the orthotic stand. The patient was positioned in  subtalar neutral position and 10 degrees of ankle dorsiflexion in a weight bearing stance. After completion of molding, a stable base was applied to the orthotic blank. The blank was ground to a stable position for weight bearing. Size: 7 Base[facility] Additional Posting and Padding: 1st ray post on right  The patient ambulated these, and they were very comfortable.  I spent 40 minutes with this patient, greater than 50% was face-to-face time counseling regarding the below diagnosis.

## 2016-08-21 NOTE — Assessment & Plan Note (Signed)
She had some relief with the greens fore insoles that she was fitted for custom orthotics today. A first ray post was placed on the right foot may need this on the left foot as well. She will drop these off if there are any adjustments that need to be made.  Patient was fitted for a standard, cushioned, semi-rigid orthotic. The orthotic was heated and afterward the patient stood on the orthotic blank positioned on the orthotic stand. The patient was positioned in subtalar neutral position and 10 degrees of ankle dorsiflexion in a weight bearing stance. After completion of molding, a stable base was applied to the orthotic blank. The blank was ground to a stable position for weight bearing. Size: 7 [facility] Additional Posting and Padding: 1st ray post on right  The patient ambulated these, and they were very comfortable.  I spent 40 minutes with this patient, greater than 50% was face-to-face time counseling regarding the below diagnosis.

## 2016-08-28 ENCOUNTER — Other Ambulatory Visit: Payer: Self-pay | Admitting: Obstetrics

## 2016-08-28 ENCOUNTER — Other Ambulatory Visit: Payer: Self-pay | Admitting: Obstetrics & Gynecology

## 2016-08-28 DIAGNOSIS — Z1231 Encounter for screening mammogram for malignant neoplasm of breast: Secondary | ICD-10-CM

## 2016-09-17 ENCOUNTER — Ambulatory Visit
Admission: RE | Admit: 2016-09-17 | Discharge: 2016-09-17 | Disposition: A | Discharge status: Home / Self Care | Payer: BC Managed Care – PPO | Source: Ambulatory Visit | Attending: Obstetrics | Admitting: Obstetrics

## 2016-09-17 DIAGNOSIS — Z1231 Encounter for screening mammogram for malignant neoplasm of breast: Secondary | ICD-10-CM

## 2017-01-28 ENCOUNTER — Ambulatory Visit (HOSPITAL_COMMUNITY): Admit: 2017-01-28 | Payer: BC Managed Care – PPO | Admitting: Gastroenterology

## 2017-01-28 ENCOUNTER — Encounter (HOSPITAL_COMMUNITY): Payer: Self-pay

## 2017-01-28 SURGERY — COLONOSCOPY WITH PROPOFOL
Anesthesia: Monitor Anesthesia Care

## 2017-02-21 ENCOUNTER — Ambulatory Visit: Payer: BC Managed Care – PPO | Admitting: Sports Medicine

## 2017-02-21 VITALS — BP 108/82 | Ht 65.0 in | Wt 145.0 lb

## 2017-02-21 DIAGNOSIS — G5790 Unspecified mononeuropathy of unspecified lower limb: Secondary | ICD-10-CM | POA: Diagnosis not present

## 2017-02-21 MED ORDER — GABAPENTIN 100 MG PO CAPS: ORAL_CAPSULE | ORAL | 0 refills | Status: DC

## 2017-02-21 NOTE — Progress Notes (Addendum)
   Subjective:    Patient ID: Nicole Davila, female    DOB: 1960-12-13, 56 y.o.   MRN: [id number]  HPI Pt was seen initially in April of this year for bilateral foot pain.  She was given green sports insoles that gave her some mild relief and returned one month later and was given custom orthotics.  Patient reports problems with her feet since she was 56 years old and was given special shoes.  She also had pain from a Morton's neuroma on left foot that gradually resolved with wider shoes.    Today she describes continuation of the same pain as before.  The majority of her pain seems to be located in the plantar surface of both feet particularly on the first MTP joint areas and the PIP joints.  Additionally, she reports a burning sensation between the first and second phalanges and metatarsals of the  of the R foot. Additionally, she reports pain on the dorsum of the right foot that "feels like a horse is stepping on it". She has had EMG testing done in the past of the legs and feet which was negative.    She notes that this pain is improved 60 to 70% with consistent orthotic use  Past Hx Bipolar disorder well controlled on lithium Hypothyroid problems treated with synthroid  Review of Systems As per HPI No sciatica No numbness in hands    Objective:   Physical Exam  Constitutional: She appears well-developed and well-nourished.  HENT:  Head: Normocephalic and atraumatic.  Musculoskeletal:  R foot: Loss of longitudinal and transverse arches No abnormal calluses Tenderness in 1st MTP joint and PIP joint Second-fifth distal phalanges hypoplastic  Pt with pronation  L foot: Loss of longitudinal and transverse arches No abnormal calluses Tenderness in 1st MTP joint and PIP joint Second-fifth distal phalanges hypoplastic  Pt with pronation  Skin: No rash noted. No erythema. No pallor.  Vitals reviewed.   Addendeum:  RT 2nd to 5th are shortened MTs - brachymetatarsia          Assessment & Plan:  1. 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  Her presentation today suggested the mechanical support given by her custom orthotics was very helpful but there is still a neuropathic component to her pain.  This likely stems from an element of tarsal tunnel syndrome.    -continue using orthotics, added scaphoid pads to pts dress shoes today and suggested if she had more shoes that her inserts wouldn't fit in to add pads to them as well. -added low dose gabapentin to help address neuropathic component of pts pain.    We discussed sideeffects and will watch for any problems using with her lithium  I observed and examined the patient with the resident and agree with assessment and plan.  Note reviewed and modified by me. Enid BaasKarl Fields, MD

## 2017-02-22 ENCOUNTER — Encounter: Payer: Self-pay | Admitting: Sports Medicine

## 2017-03-20 ENCOUNTER — Other Ambulatory Visit: Payer: Self-pay | Admitting: Internal Medicine

## 2017-03-20 DIAGNOSIS — G5790 Unspecified mononeuropathy of unspecified lower limb: Secondary | ICD-10-CM

## 2017-03-24 ENCOUNTER — Other Ambulatory Visit: Payer: Self-pay | Admitting: Internal Medicine

## 2017-03-24 DIAGNOSIS — G5790 Unspecified mononeuropathy of unspecified lower limb: Secondary | ICD-10-CM

## 2017-04-10 ENCOUNTER — Other Ambulatory Visit: Payer: Self-pay | Admitting: Internal Medicine

## 2017-04-10 DIAGNOSIS — G5790 Unspecified mononeuropathy of unspecified lower limb: Secondary | ICD-10-CM

## 2017-07-19 ENCOUNTER — Encounter: Payer: Self-pay | Admitting: Psychology

## 2017-10-24 ENCOUNTER — Other Ambulatory Visit: Payer: Self-pay | Admitting: Obstetrics

## 2017-10-24 DIAGNOSIS — Z1231 Encounter for screening mammogram for malignant neoplasm of breast: Secondary | ICD-10-CM

## 2017-11-19 ENCOUNTER — Ambulatory Visit
Admission: RE | Admit: 2017-11-19 | Discharge: 2017-11-19 | Disposition: A | Discharge status: Home / Self Care | Payer: BC Managed Care – PPO | Source: Ambulatory Visit | Attending: Obstetrics | Admitting: Obstetrics

## 2017-11-19 DIAGNOSIS — Z1231 Encounter for screening mammogram for malignant neoplasm of breast: Secondary | ICD-10-CM

## 2017-12-10 ENCOUNTER — Ambulatory Visit (INDEPENDENT_AMBULATORY_CARE_PROVIDER_SITE_OTHER): Payer: BC Managed Care – PPO | Admitting: Psychology

## 2017-12-10 DIAGNOSIS — R413 Other amnesia: Secondary | ICD-10-CM
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
## 2017-12-11 ENCOUNTER — Encounter: Payer: Self-pay | Admitting: Psychology

## 2017-12-11 ENCOUNTER — Ambulatory Visit: Payer: BC Managed Care – PPO | Admitting: Psychology

## 2017-12-11 DIAGNOSIS — R413 Other amnesia: Secondary | ICD-10-CM

## 2017-12-11 NOTE — Progress Notes (Signed)
   Neuropsychology Note  Nicole Davila completed 195 minutes of neuropsychological testing with technician, Wallace Kellerana Chamberlain, BS, under the supervision of Dr. Elvis CoilMaryBeth Bailar, Licensed Psychologist. The patient did not appear overtly distressed by the testing session, per behavioral observation or via self-report to the technician. Rest breaks were offered.   Clinical Decision Making: In considering the patient's current level of functioning, level of presumed impairment, nature of symptoms, emotional and behavioral responses during the interview, level of literacy, and observed level of motivation/effort, a battery of tests was selected and communicated to the psychometrician.  Communication between the psychologist and technician was ongoing throughout the testing session and changes were made as deemed necessary based on patient performance on testing, technician observations and additional pertinent factors such as those listed above.  Nicole Davila will return within approximately 2 weeks for an interactive feedback session with Dr. Alinda DoomsBailar at which time her test performances, clinical impressions and treatment recommendations will be reviewed in detail. The patient understands she can contact our office should she require our assistance before this time.  35 minutes spent performing neuropsychological evaluation services/clinical decision making (psychologist). [CPT 96132] 195 minutes spent face-to-face with patient administering standardized tests, 60 minutes spent scoring (technician). [CPT [id number], 96139]  Full report to follow.

## 2017-12-16 ENCOUNTER — Encounter: Payer: Self-pay | Admitting: Psychology
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
## 2017-12-30 ENCOUNTER — Ambulatory Visit (INDEPENDENT_AMBULATORY_CARE_PROVIDER_SITE_OTHER): Payer: BC Managed Care – PPO | Admitting: Psychology

## 2017-12-30 ENCOUNTER — Encounter: Payer: Self-pay | Admitting: Psychology

## 2017-12-30 DIAGNOSIS — R413 Other amnesia: Secondary | ICD-10-CM

## 2017-12-30 DIAGNOSIS — F908 Attention-deficit hyperactivity disorder, other type: Secondary | ICD-10-CM

## 2017-12-30 NOTE — Patient Instructions (Signed)
Test results are consistent with persisting ADHD (relative weakness in sustained attention and working memory).   There is no indication of any other neurocognitive disorder or early onset dementia.   I suspect psychosocial stress has exacerbated ADHD. Mild depression also likely playing a role.   Strategies to enhance cognitive functioning Attention, concentration, memory encoding and consolidation    . Make a plan and be prepared o If you find that you are more attentive at certain times of the day (i.e., the morning), plan important activities and discussions at that time o Determine which activities take the most time and which are most important, then prioritize your "to do list" based on this information o Break tasks into simpler parts, understand the steps involved before starting o Rehearse the steps mentally or write them down. If you write them down, you can use this as a checklist to check off as you complete them. o Visualize completing the task  . Use external aids  o Write everything down that you do not need to know or work with right now. Don't store extra information in your brain that you don't need right now.  o Use a calendar or planner to make checklists, due dates and "to do" lists. o Use ONLY ONE calendar or planner and look at it frequently o Set alarms for important deadlines or appointments  . Minimize interruptions and distractions  o Find a good work environment, e.g., quiet room with a desk, close curtains, use earplugs, mask sounds with a fan or white noise machine o Turn off cell phone and/or email alerts during important tasks. In fact, it is helpful to schedule a block of time each day where you limit phone and email interruptions and focus on just the more detailed work you have. o Try to minimize the amount of background noise (i.e., television, music) when engaged in important tasks or conversations with others (note that some individuals find soft  background music helpful in minimize distraction, so you may need to experiment with optimal level of noise for specific situations)  . Use active effort = consciously attending to details, closely analyzing o Failures of encoding may reflect failure to attend to one's own actions o Be prepared to work more slowly than you usually do  o When reading, allow time for re-reading sections  o Check your work for errors  . Avoid multitasking o Do not attempt to complete more than one task at one time. Focus on one task until it is completed and then move on to the next one. o Avoid other activities while engaged in important tasks, such as talking on the phone while balancing the checkbook, making a shopping list during a meeting.   . Use self-talk during tasks o Repeat the steps of the activity to yourself as you complete them o Talk to yourself about your progress o This helps you maintain focus on the task and makes it easier to remember completing the task (Similar to "active effort" above)  . Conserve energy o Conserve energy to avoid fatigue and its effects on cognition - Get enough sleep - Pace yourself  and make sure to take breaks - Be open to receiving help - Exercise for increased energy  . Conversational vigilance = paying attention during a conversation  o Listen actively: focus on the speaker and position yourself so that you can clearly hear the him/her, and have open/relaxed posture  o Eye contact: Maintaining eye contact with the person you  are speaking with may increase the likelihood that important information is properly received  o Ask questions: Ask questions for clarification (e.g., request that the speaker explain something in a different way) or ask for information to be repeated if you become distracted, or if you do not hear or understand something during a conversation  o Paraphrase: Summarize or repeat back important information from a conversation in your own  words to facilitate communication and ensure that you have heard correctly and understand

## 2018-01-14 ENCOUNTER — Encounter: Payer: BC Managed Care – PPO | Admitting: Psychology

## 2018-01-22 ENCOUNTER — Ambulatory Visit: Payer: BC Managed Care – PPO | Admitting: Registered"

## 2018-09-22 ENCOUNTER — Other Ambulatory Visit: Payer: Self-pay | Admitting: Family Medicine

## 2018-09-22 DIAGNOSIS — R110 Nausea: Secondary | ICD-10-CM

## 2018-09-23 ENCOUNTER — Ambulatory Visit
Admission: RE | Admit: 2018-09-23 | Discharge: 2018-09-23 | Disposition: A | Discharge status: Home / Self Care | Payer: BC Managed Care – PPO | Source: Ambulatory Visit | Attending: Family Medicine | Admitting: Family Medicine

## 2018-09-23 DIAGNOSIS — R110 Nausea: Secondary | ICD-10-CM

## 2018-11-03 ENCOUNTER — Other Ambulatory Visit: Payer: Self-pay | Admitting: Family Medicine

## 2018-11-03 ENCOUNTER — Other Ambulatory Visit: Payer: Self-pay | Admitting: Obstetrics

## 2018-11-03 DIAGNOSIS — R29818 Other symptoms and signs involving the nervous system: Secondary | ICD-10-CM

## 2018-11-03 DIAGNOSIS — Z1231 Encounter for screening mammogram for malignant neoplasm of breast: Secondary | ICD-10-CM

## 2018-11-06 ENCOUNTER — Ambulatory Visit
Admission: RE | Admit: 2018-11-06 | Discharge: 2018-11-06 | Disposition: A | Discharge status: Home / Self Care | Payer: BC Managed Care – PPO | Source: Ambulatory Visit | Attending: Family Medicine | Admitting: Family Medicine

## 2018-11-06 ENCOUNTER — Other Ambulatory Visit: Payer: Self-pay

## 2018-11-06 ENCOUNTER — Other Ambulatory Visit (HOSPITAL_COMMUNITY): Payer: BC Managed Care – PPO

## 2018-11-06 DIAGNOSIS — R29818 Other symptoms and signs involving the nervous system: Secondary | ICD-10-CM

## 2018-11-10 ENCOUNTER — Ambulatory Visit
Admission: RE | Admit: 2018-11-10 | Discharge: 2018-11-10 | Disposition: A | Discharge status: Home / Self Care | Payer: BC Managed Care – PPO | Source: Ambulatory Visit | Attending: Family Medicine | Admitting: Family Medicine

## 2018-11-10 DIAGNOSIS — R29818 Other symptoms and signs involving the nervous system: Secondary | ICD-10-CM

## 2018-12-18 ENCOUNTER — Ambulatory Visit
Admission: RE | Admit: 2018-12-18 | Discharge: 2018-12-18 | Disposition: A | Discharge status: Home / Self Care | Payer: BC Managed Care – PPO | Source: Ambulatory Visit | Attending: Obstetrics | Admitting: Obstetrics

## 2018-12-18 ENCOUNTER — Other Ambulatory Visit: Payer: Self-pay

## 2018-12-18 DIAGNOSIS — Z1231 Encounter for screening mammogram for malignant neoplasm of breast: Secondary | ICD-10-CM

## 2019-02-05 ENCOUNTER — Other Ambulatory Visit: Payer: Self-pay | Admitting: Physician Assistant

## 2019-02-05 DIAGNOSIS — R1310 Dysphagia, unspecified: Secondary | ICD-10-CM

## 2019-02-05 DIAGNOSIS — K219 Gastro-esophageal reflux disease without esophagitis: Secondary | ICD-10-CM

## 2019-02-05 DIAGNOSIS — R1319 Other dysphagia: Secondary | ICD-10-CM

## 2019-02-13 ENCOUNTER — Ambulatory Visit
Admission: RE | Admit: 2019-02-13 | Discharge: 2019-02-13 | Disposition: A | Discharge status: Home / Self Care | Payer: BC Managed Care – PPO | Source: Ambulatory Visit | Attending: Physician Assistant | Admitting: Physician Assistant

## 2019-02-13 DIAGNOSIS — R1319 Other dysphagia: Secondary | ICD-10-CM

## 2019-02-13 DIAGNOSIS — K219 Gastro-esophageal reflux disease without esophagitis: Secondary | ICD-10-CM

## 2019-02-13 DIAGNOSIS — R1310 Dysphagia, unspecified: Secondary | ICD-10-CM

## 2019-03-06 ENCOUNTER — Other Ambulatory Visit (HOSPITAL_COMMUNITY): Payer: Self-pay | Admitting: Gastroenterology

## 2019-03-06 ENCOUNTER — Other Ambulatory Visit: Payer: Self-pay | Admitting: Gastroenterology

## 2019-03-06 DIAGNOSIS — R1013 Epigastric pain: Secondary | ICD-10-CM

## 2019-03-23 ENCOUNTER — Other Ambulatory Visit (HOSPITAL_COMMUNITY): Payer: BC Managed Care – PPO

## 2019-03-23 ENCOUNTER — Encounter (HOSPITAL_COMMUNITY): Payer: Self-pay

## 2019-04-30 ENCOUNTER — Other Ambulatory Visit: Payer: Self-pay | Admitting: Gastroenterology

## 2019-04-30 DIAGNOSIS — R1010 Upper abdominal pain, unspecified: Secondary | ICD-10-CM

## 2019-05-13 ENCOUNTER — Ambulatory Visit
Admission: RE | Admit: 2019-05-13 | Discharge: 2019-05-13 | Disposition: A | Discharge status: Home / Self Care | Payer: BC Managed Care – PPO | Source: Ambulatory Visit | Attending: Gastroenterology | Admitting: Gastroenterology

## 2019-05-13 DIAGNOSIS — R1010 Upper abdominal pain, unspecified: Secondary | ICD-10-CM

## 2019-05-20 ENCOUNTER — Ambulatory Visit
Admission: RE | Admit: 2019-05-20 | Discharge: 2019-05-20 | Disposition: A | Discharge status: Home / Self Care | Payer: BC Managed Care – PPO | Source: Ambulatory Visit | Attending: Gastroenterology | Admitting: Gastroenterology

## 2019-05-20 MED ORDER — IOPAMIDOL (ISOVUE-300) INJECTION 61%
100.0000 mL | Freq: Once | INTRAVENOUS | Status: AC | PRN
  Administered 2019-05-20: 100 mL via INTRAVENOUS

## 2019-12-10 ENCOUNTER — Other Ambulatory Visit: Payer: BC Managed Care – PPO

## 2019-12-30 ENCOUNTER — Other Ambulatory Visit: Payer: Self-pay | Admitting: Obstetrics

## 2019-12-30 DIAGNOSIS — Z1231 Encounter for screening mammogram for malignant neoplasm of breast: Secondary | ICD-10-CM

## 2020-01-29 ENCOUNTER — Ambulatory Visit: Payer: Self-pay | Attending: Internal Medicine

## 2020-01-29 DIAGNOSIS — Z23 Encounter for immunization: Secondary | ICD-10-CM

## 2020-01-29 NOTE — Progress Notes (Signed)
   Cov[facility] Vaccination [facility]  Name:  Nicole Davila    MRN: [id number] DOB: Mar 24, 1961  01/29/2020  [name] was observed post Covid-19 immunization for 15 minutes without incident. She was provided with Vaccine Information Sheet and instruction to access the V-Safe system.   [name] was instructed to call 911 with any severe reactions post vaccine: Marland Kitchen Difficulty breathing  . Swelling of face and throat  . A fast heartbeat  . A bad rash all over body  . Dizziness and weakness

## 2020-02-08 ENCOUNTER — Ambulatory Visit: Payer: BC Managed Care – PPO

## 2020-03-14 ENCOUNTER — Other Ambulatory Visit: Payer: Self-pay

## 2020-03-14 ENCOUNTER — Ambulatory Visit
Admission: RE | Admit: 2020-03-14 | Discharge: 2020-03-14 | Disposition: A | Discharge status: Home / Self Care | Payer: BC Managed Care – PPO | Source: Ambulatory Visit | Attending: Obstetrics | Admitting: Obstetrics

## 2020-03-14 DIAGNOSIS — Z1231 Encounter for screening mammogram for malignant neoplasm of breast: Secondary | ICD-10-CM

## 2020-03-28 ENCOUNTER — Other Ambulatory Visit: Payer: BC Managed Care – PPO

## 2020-03-28 DIAGNOSIS — Z20822 Contact with and (suspected) exposure to covid-19: Secondary | ICD-10-CM

## 2020-03-29 LAB — NOVEL CORONAVIRUS, NAA: SARS-CoV-2, NAA: NOT DETECTED

## 2020-03-29 LAB — SARS-COV-2, NAA 2 DAY TAT

## 2020-05-02 ENCOUNTER — Other Ambulatory Visit: Payer: Self-pay | Admitting: Gastroenterology

## 2020-05-02 DIAGNOSIS — R1013 Epigastric pain: Secondary | ICD-10-CM

## 2020-07-28 IMAGING — MG DIGITAL SCREENING BILATERAL MAMMOGRAM WITH TOMO AND CAD
8 series · 9 of 24 positions shown · non-contrast
Comparison: Previous exam(s).

CLINICAL DATA: Screening.

EXAM:
DIGITAL SCREENING BILATERAL MAMMOGRAM WITH TOMO AND CAD

[R MLO synth-2D]
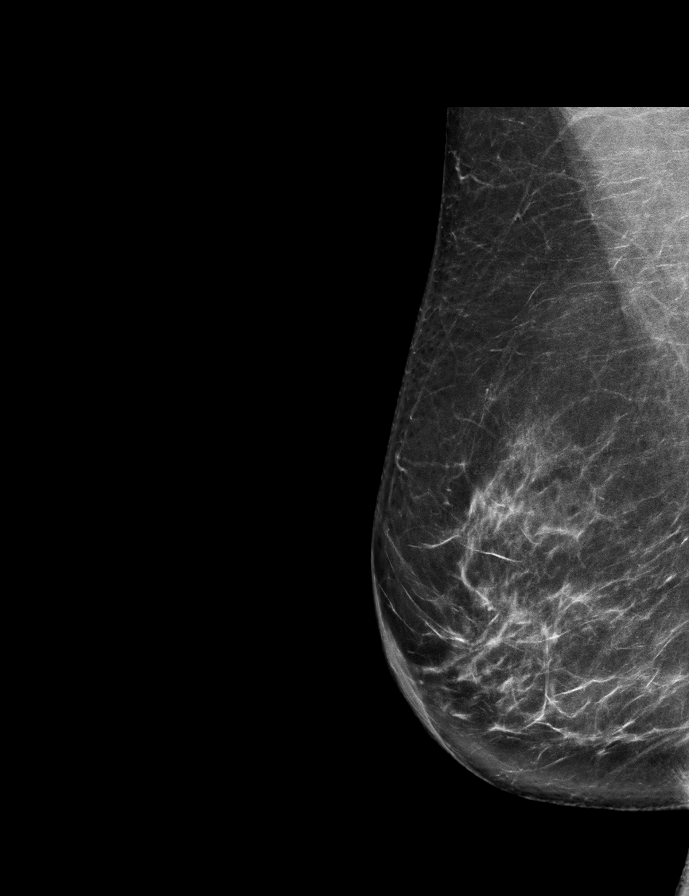

[R CC synth-2D]
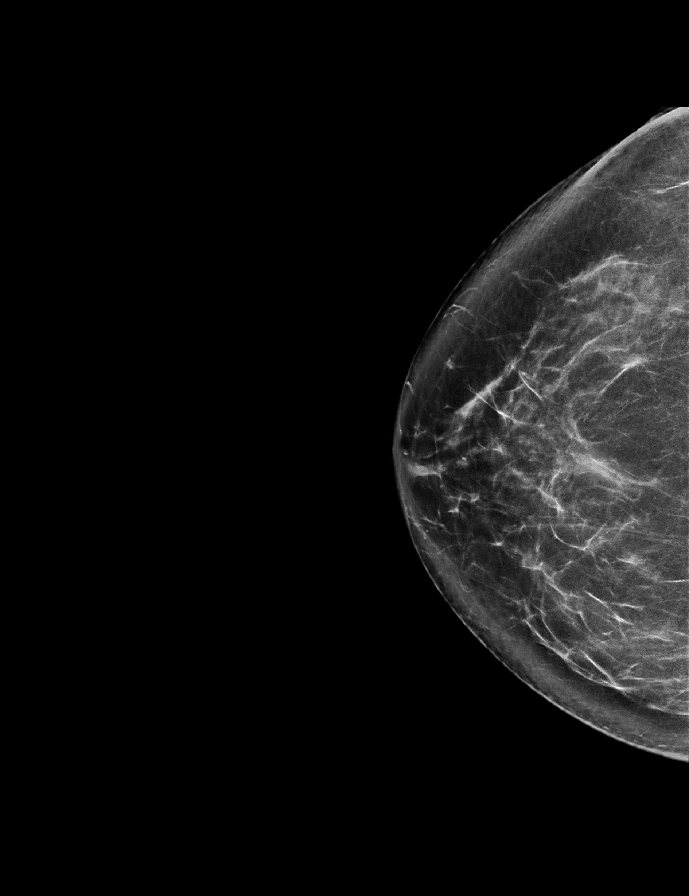

[L MLO synth-2D]
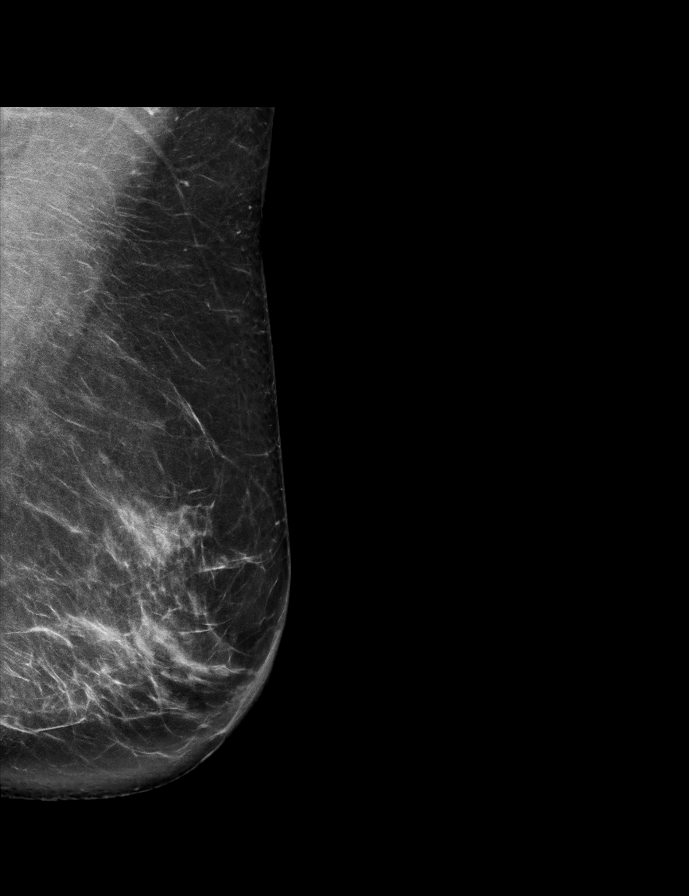

[L CC synth-2D]
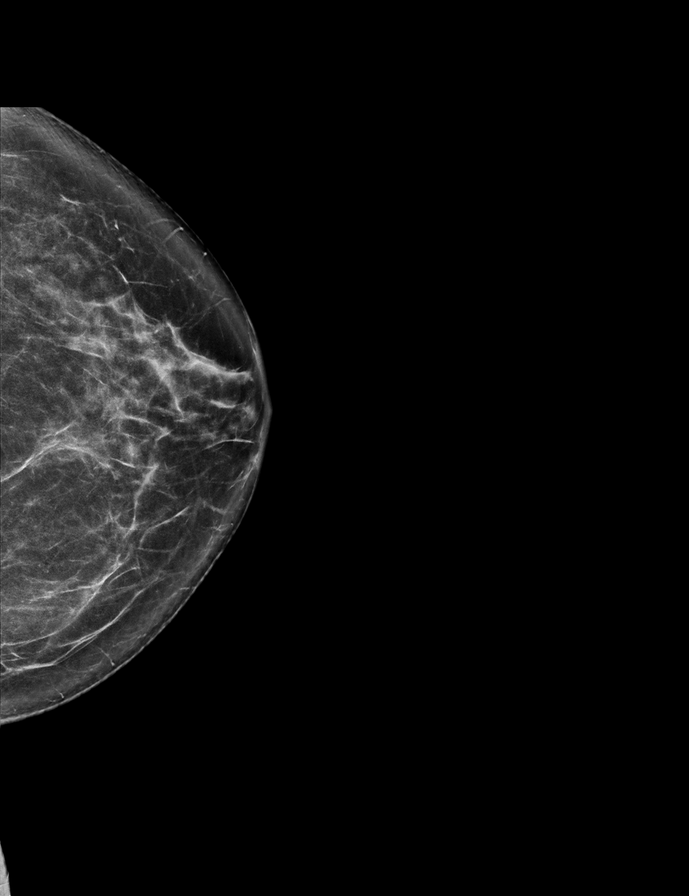

[L MLO tomo · 2 of 78 frames shown]
[frame 26/78]
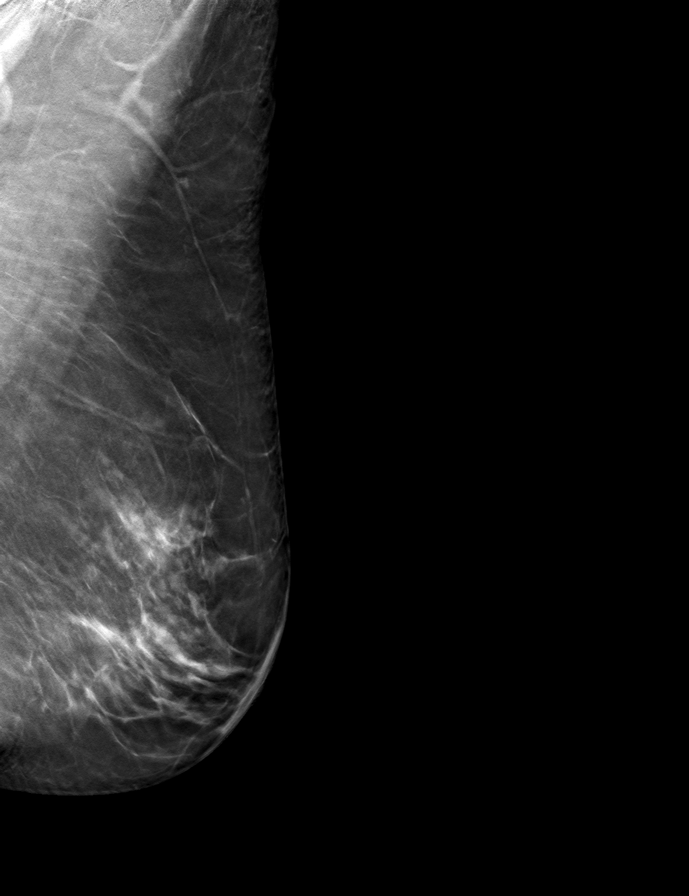
[frame 39/78]
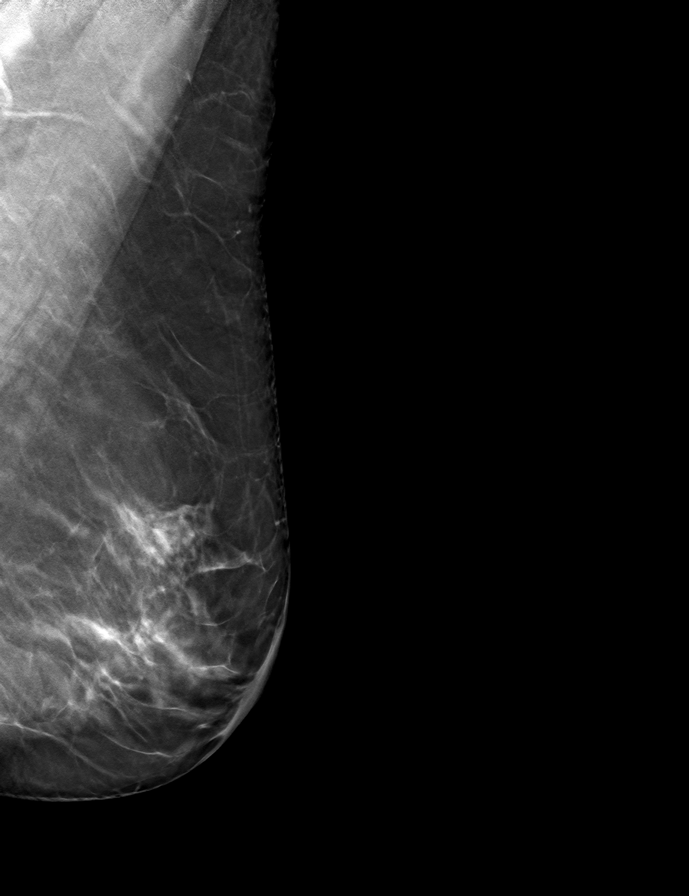

[R CC tomo · tomo slice 37/73.0]
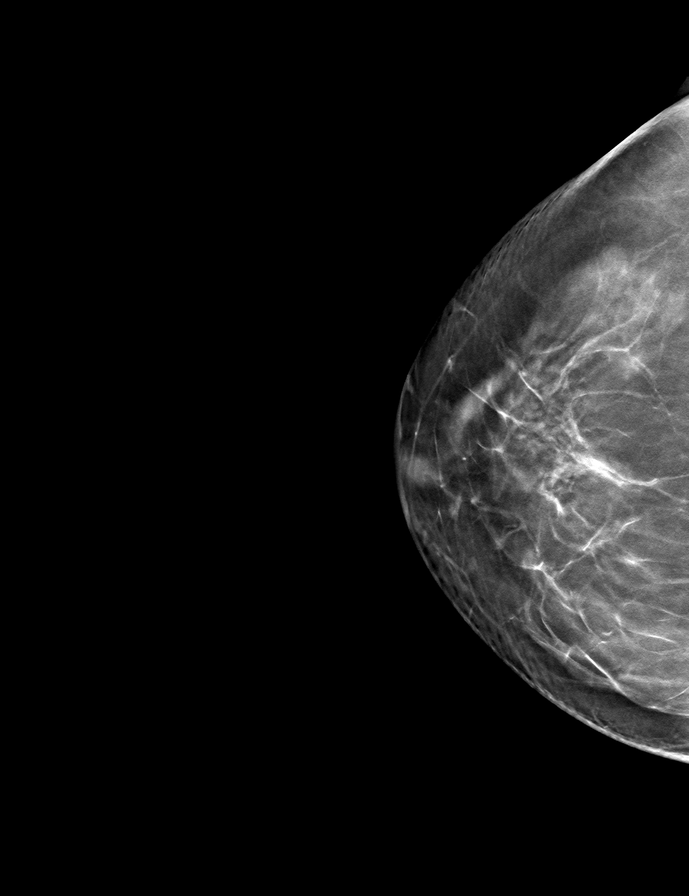

[R MLO tomo · tomo slice 37/74.0]
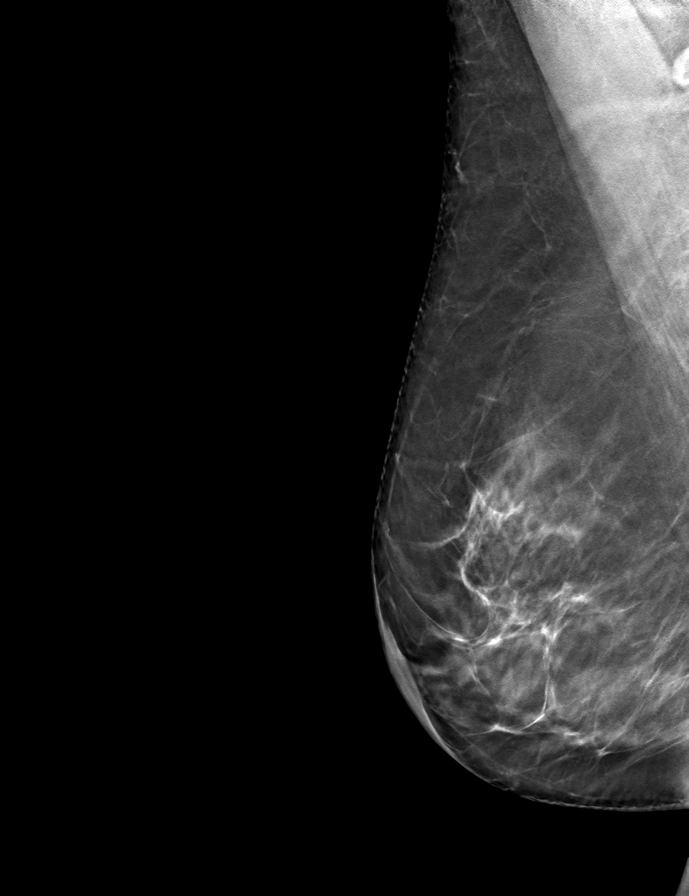

[L CC tomo · tomo slice 35/68.0]
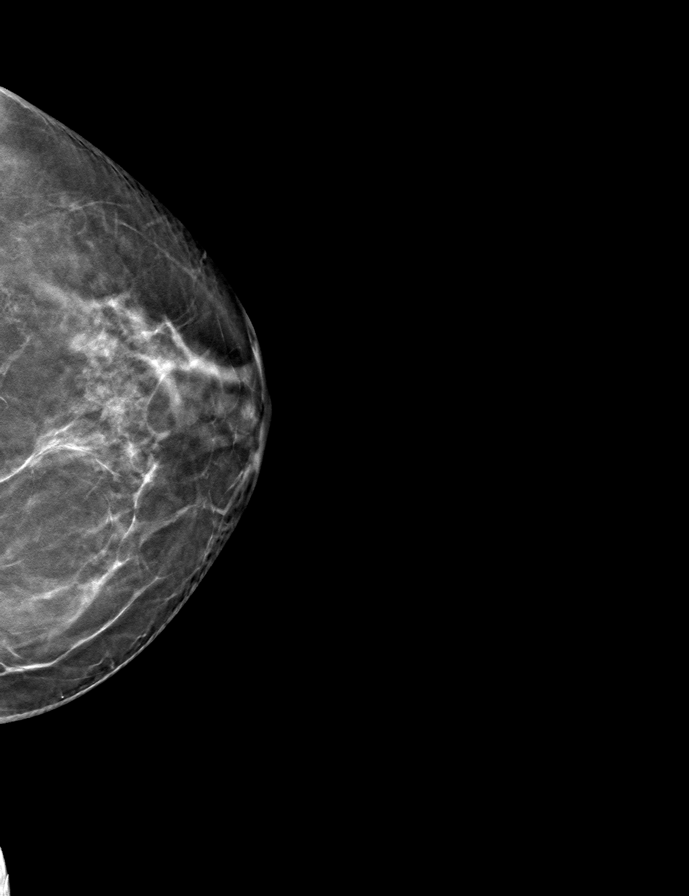

[9 of 24 positions shown; findings below may reference images not displayed]

ACR Breast Density Category b: There are scattered areas of
fibroglandular density.
FINDINGS: There are no findings suspicious for malignancy. Images were
processed with CAD.
IMPRESSION: No mammographic evidence of malignancy. A result letter of this
screening mammogram will be mailed directly to the patient.

RECOMMENDATION:
Screening mammogram in one year. (Code:[id number])

BI-RADS CATEGORY  1: Negative.

## 2021-03-01 ENCOUNTER — Other Ambulatory Visit: Payer: Self-pay | Admitting: Obstetrics

## 2021-03-01 DIAGNOSIS — Z1231 Encounter for screening mammogram for malignant neoplasm of breast: Secondary | ICD-10-CM

## 2021-04-04 ENCOUNTER — Ambulatory Visit
Admission: RE | Admit: 2021-04-04 | Discharge: 2021-04-04 | Disposition: A | Discharge status: Home / Self Care | Payer: BC Managed Care – PPO | Source: Ambulatory Visit | Attending: Obstetrics | Admitting: Obstetrics

## 2021-04-04 DIAGNOSIS — Z1231 Encounter for screening mammogram for malignant neoplasm of breast: Secondary | ICD-10-CM

## 2021-04-05 ENCOUNTER — Other Ambulatory Visit: Payer: Self-pay | Admitting: Obstetrics

## 2021-04-05 DIAGNOSIS — R928 Other abnormal and inconclusive findings on diagnostic imaging of breast: Secondary | ICD-10-CM

## 2021-05-09 ENCOUNTER — Ambulatory Visit
Admission: RE | Admit: 2021-05-09 | Discharge: 2021-05-09 | Disposition: A | Discharge status: Home / Self Care | Payer: BC Managed Care – PPO | Source: Ambulatory Visit | Attending: Obstetrics | Admitting: Obstetrics

## 2021-05-09 DIAGNOSIS — R928 Other abnormal and inconclusive findings on diagnostic imaging of breast: Secondary | ICD-10-CM

## 2022-02-03 ENCOUNTER — Other Ambulatory Visit (HOSPITAL_BASED_OUTPATIENT_CLINIC_OR_DEPARTMENT_OTHER): Payer: Self-pay

## 2022-02-03 MED ORDER — INFLUENZA VAC SPLIT QUAD 0.5 ML IM SUSY
PREFILLED_SYRINGE | INTRAMUSCULAR | 0 refills | Status: DC
  Filled 2022-02-03: qty 0.5, 1d supply, fill #0

## 2022-03-02 ENCOUNTER — Other Ambulatory Visit: Payer: Self-pay | Admitting: Family Medicine

## 2022-03-02 DIAGNOSIS — Z1231 Encounter for screening mammogram for malignant neoplasm of breast: Secondary | ICD-10-CM

## 2022-04-26 ENCOUNTER — Ambulatory Visit
Admission: RE | Admit: 2022-04-26 | Discharge: 2022-04-26 | Disposition: A | Discharge status: Home / Self Care | Payer: BC Managed Care – PPO | Source: Ambulatory Visit | Attending: Family Medicine | Admitting: Family Medicine

## 2022-04-26 DIAGNOSIS — Z1231 Encounter for screening mammogram for malignant neoplasm of breast: Secondary | ICD-10-CM

## 2022-11-06 ENCOUNTER — Other Ambulatory Visit (HOSPITAL_BASED_OUTPATIENT_CLINIC_OR_DEPARTMENT_OTHER): Payer: Self-pay | Admitting: Family Medicine

## 2022-11-06 DIAGNOSIS — E785 Hyperlipidemia, unspecified: Secondary | ICD-10-CM

## 2022-11-08 ENCOUNTER — Ambulatory Visit (HOSPITAL_BASED_OUTPATIENT_CLINIC_OR_DEPARTMENT_OTHER)
Admission: RE | Admit: 2022-11-08 | Discharge: 2022-11-08 | Disposition: A | Discharge status: Home / Self Care | Payer: BC Managed Care – PPO | Source: Ambulatory Visit | Attending: Family Medicine | Admitting: Family Medicine

## 2022-11-08 DIAGNOSIS — E785 Hyperlipidemia, unspecified: Secondary | ICD-10-CM | POA: Insufficient documentation

## 2023-03-12 ENCOUNTER — Other Ambulatory Visit: Payer: Self-pay

## 2023-03-12 ENCOUNTER — Emergency Department (HOSPITAL_BASED_OUTPATIENT_CLINIC_OR_DEPARTMENT_OTHER): Payer: BC Managed Care – PPO | Admitting: Radiology

## 2023-03-12 ENCOUNTER — Encounter (HOSPITAL_BASED_OUTPATIENT_CLINIC_OR_DEPARTMENT_OTHER): Payer: Self-pay | Admitting: Emergency Medicine

## 2023-03-12 ENCOUNTER — Emergency Department (HOSPITAL_BASED_OUTPATIENT_CLINIC_OR_DEPARTMENT_OTHER)
Admission: EM | Admit: 2023-03-12 | Discharge: 2023-03-12 | Disposition: A | Discharge status: Home / Self Care | Payer: BC Managed Care – PPO | Attending: Emergency Medicine | Admitting: Emergency Medicine

## 2023-03-12 DIAGNOSIS — E039 Hypothyroidism, unspecified: Secondary | ICD-10-CM | POA: Insufficient documentation

## 2023-03-12 DIAGNOSIS — R0602 Shortness of breath: Secondary | ICD-10-CM | POA: Diagnosis present

## 2023-03-12 LAB — BASIC METABOLIC PANEL
Anion gap: 9 (ref 5–15)
BUN: 16 mg/dL (ref 8–23)
CO2: 24 mmol/L (ref 22–32)
Calcium: 10.6 mg/dL — ABNORMAL HIGH (ref 8.9–10.3)
Chloride: 107 mmol/L (ref 98–111)
Creatinine, Ser: 0.86 mg/dL (ref 0.44–1.00)
GFR, Estimated: >60 mL/min (ref >60)
Glucose, Bld: 114 mg/dL — ABNORMAL HIGH (ref 70–99)
Potassium: 3.5 mmol/L (ref 3.5–5.1)
Sodium: 140 mmol/L (ref 135–145)

## 2023-03-12 LAB — D-DIMER, QUANTITATIVE: D-Dimer, Quant: 0.27 ug{FEU}/mL (ref 0.00–0.50)

## 2023-03-12 LAB — CBC WITH DIFFERENTIAL/PLATELET
Abs Immature Granulocytes: 0.02 10*3/uL (ref 0.00–0.07)
Basophils Absolute: 0 10*3/uL (ref 0.0–0.1)
Basophils Relative: 1 %
Eosinophils Absolute: 0.2 10*3/uL (ref 0.0–0.5)
Eosinophils Relative: 2 %
HCT: 42.2 % (ref 36.0–46.0)
Hemoglobin: 13.5 g/dL (ref 12.0–15.0)
Immature Granulocytes: 0 %
Lymphocytes Relative: 29 %
Lymphs Abs: 2.1 10*3/uL (ref 0.7–4.0)
MCH: 29.9 pg (ref 26.0–34.0)
MCHC: 32 g/dL (ref 30.0–36.0)
MCV: 93.6 fL (ref 80.0–100.0)
Monocytes Absolute: 0.5 10*3/uL (ref 0.1–1.0)
Monocytes Relative: 7 %
Neutro Abs: 4.4 10*3/uL (ref 1.7–7.7)
Neutrophils Relative %: 61 %
Platelets: 339 10*3/uL (ref 150–400)
RBC: 4.51 MIL/uL (ref 3.87–5.11)
RDW: 12.8 % (ref 11.5–15.5)
WBC: 7.2 10*3/uL (ref 4.0–10.5)
nRBC: 0 % (ref 0.0–0.2)

## 2023-03-12 LAB — BRAIN NATRIURETIC PEPTIDE: B Natriuretic Peptide: 24.3 pg/mL (ref 0.0–100.0)

## 2023-03-12 MED ORDER — ALBUTEROL SULFATE HFA 108 (90 BASE) MCG/ACT IN AERS: 1.0000 | INHALATION_SPRAY | Freq: Four times a day (QID) | RESPIRATORY_TRACT | 0 refills | Status: DC | PRN

## 2023-03-12 MED ORDER — ALBUTEROL SULFATE HFA 108 (90 BASE) MCG/ACT IN AERS
1.0000 | INHALATION_SPRAY | Freq: Four times a day (QID) | RESPIRATORY_TRACT | 0 refills | Status: AC | PRN
Start: 2023-03-12 — End: ?

## 2023-03-12 MED ORDER — ALBUTEROL SULFATE HFA 108 (90 BASE) MCG/ACT IN AERS
2.0000 | INHALATION_SPRAY | RESPIRATORY_TRACT | Status: DC | PRN
  Administered 2023-03-12: 1 via RESPIRATORY_TRACT
  Filled 2023-03-12: qty 6.7

## 2023-03-12 NOTE — ED Notes (Signed)
Reviewed discharge instructions, medications, and home care with pt. Pt verbalized understanding and had no further questions. Pt exited ED without complications.

## 2023-03-12 NOTE — Discharge Instructions (Addendum)
Please follow-up with your primary care provider in regards recent symptoms and ER visit.  Today your labs and imaging are reassuring and the shortness of breath may be related to your sleep apnea and you may need a CPAP machine.  I have prescribed you an albuterol inhaler to use as prescribed.  If symptoms change or worsen please return to the ER.

## 2023-03-12 NOTE — ED Notes (Signed)
ED Provider at bedside. 

## 2023-03-12 NOTE — ED Provider Notes (Signed)
[place] EMERGENCY DEPARTMENT AT [facility] Provider Note   CSN: [id number] Arrival date & time: 03/12/23  1651     History  Chief Complaint  Patient presents with   Shortness of Breath    Nicole Davila is a 62 y.o. female history of bipolar, hypothyroid, hypercholesteremia, GERD, nonalcoholic fatty liver disease, shortness of breath for 4 months.  Patient states that she noticed this when she is exerting yourself and when she is sleeping.  Patient states that she is in the process of being worked up for OSA and does not use CPAP machine at this time.  Patient denies any respiratory history or current cigarette use, chest pain, hemoptysis, leg swelling, recent travel/hospitalization or surgeries, personal history of cancer, chest pain.  Patient went to primary care provider's office today and was told to come to the ER to have a CT scan to rule out a PE.  Patient states he has not had any blood drawn for this and denies any previous history of blood clots and states that she is only short of breath when she is exerting herself or when she is sleeping.  Her wife states that patient will stop breathing when she is sleeping.  Patient denies asthma or COPD history.    Home Medications Prior to Admission medications   Medication Sig Start Date End Date Taking? Authorizing Provider  albuterol (VENTOLIN HFA) 108 (90 Base) MCG/ACT inhaler Inhale 1-2 puffs into the lungs every 6 (six) hours as needed for wheezing or shortness of breath. 03/12/23   Netta Corrigan, PA-C  gabapentin (NEURONTIN) 100 MG capsule Take one 100mg  tablet daily at bedtime for 4 days.  If tolerating well, increase to 300mg  (3 tablets) at bedtime daily. 02/21/17   Angelita Ingles, MD  influenza vac split quadrivalent PF (FLUARIX) 0.5 ML injection Inject into the muscle. 02/03/22   Judyann Munson, MD  levothyroxine (SYNTHROID, LEVOTHROID) 100 MCG tablet Take 100 mcg by mouth daily before breakfast.    [provider]  lithium carbonate (LITHOBID) 300 MG CR tablet  06/10/16   [provider]  meclizine (ANTIVERT) 25 MG tablet Take 25 mg by mouth 2 (two) times daily as needed. 07/28/16   [provider]  omeprazole (PRILOSEC) 40 MG capsule Take 40 mg by mouth daily.    [provider]  VYVANSE 10 MG capsule  05/24/16   [provider]      Allergies    Nsaids    Review of Systems   Review of Systems  Respiratory:  Positive for shortness of breath.     Physical Exam Updated Vital Signs BP 126/60   Pulse 70   Temp 98.4 F (36.9 C) (Oral)   Resp 18   SpO2 100%  Physical Exam Constitutional:      General: She is not in acute distress. Cardiovascular:     Rate and Rhythm: Normal rate and regular rhythm.     Pulses: Normal pulses.     Heart sounds: Normal heart sounds.  Pulmonary:     Effort: Pulmonary effort is normal. No respiratory distress.     Breath sounds: Normal breath sounds.     Comments: Able to speak in full sentences Musculoskeletal:     Right lower leg: No tenderness. No edema.     Left lower leg: No tenderness. No edema.  Skin:    General: Skin is warm and dry.  Neurological:     Mental Status: She is alert.  Psychiatric:        Mood and Affect: Mood normal.     ED Results / Procedures / Treatments   Labs (all labs ordered are listed, but only abnormal results are displayed) Labs Reviewed  BASIC METABOLIC PANEL - Abnormal; Notable for the following components:      Result Value   Glucose, Bld 114 (*)    Calcium 10.6 (*)    All other components within normal limits  CBC WITH DIFFERENTIAL/PLATELET  D-DIMER, QUANTITATIVE  BRAIN NATRIURETIC PEPTIDE    EKG None  Radiology DG Chest 2 View Result Date: 03/12/2023 CLINICAL DATA:  Shortness of breath EXAM: CHEST - 2 VIEW COMPARISON:  11/05/2022 FINDINGS: The heart size and mediastinal contours are within normal limits. Both lungs are clear. The visualized skeletal  structures are unremarkable. IMPRESSION: No active cardiopulmonary disease. Electronically Signed   By: Duanne Guess D.O.   On: 03/12/2023 19:08    Procedures Procedures    Medications Ordered in ED Medications  albuterol (VENTOLIN HFA) 108 (90 Base) MCG/ACT inhaler 2 puff (1 puff Inhalation Given 03/12/23 1718)    ED Course/ Medical Decision Making/ A&P                                 Medical Decision Making Amount and/or Complexity of Data Reviewed Labs: ordered. Radiology: ordered.  Risk Prescription drug management.   Nicole Davila 62 y.o. presented today for shortness of breath.  Working DDx that I considered at this time includes, but not limited to, asthma/COPD exacerbation, URI, viral illness, anemia, ACS, PE, pneumonia, pleural effusion, lung cancer.  R/o DDx: asthma/COPD exacerbation, URI, viral illness, anemia, ACS, PE, pneumonia, pleural effusion, lung cancer: These are considered less likely due to history of present illness, physical exam, labs/imaging findings  Review of prior external notes: 03/12/2023 office visit  Unique Tests and My Interpretation:  CBC: Unremarkable BMP: Unremarkable EKG: Sinus 60 bpm, no ST elevations or depressions noted, no blocks noted D-dimer: Negative  CXR: No acute findings  Social Determinants of Health: none  Discussion with Independent Historian:  Wife  Discussion of Management of Tests: None  Risk: Medium: prescription drug management  Risk Stratification Score:  Well score: 0  Plan: On exam patient was in no acute distress with stable vitals.  Patient's was exam was largely unremarkable as she was able to speak in full sentences and does not have any leg swelling.  Patient's Wells score is currently 0 at this time as I suspect her shortness of breath is most likely from her OSA and that she will need a CPAP machine.  However patient and wife are here to rule out a PE which is reasonable and so we will get a  D-dimer along with a BNP.  Patient not requiring any oxygen at this time and is not tachycardic.  D-dimer agrees along with p.o. labs being reassuring.  Patient not requiring any oxygen here does not want to become tachycardic.  Do feel this is related to patient's OSA and will discharge her with primary care follow-up to have a sleep study done.  At time of discharge patient was requesting albuterol inhaler patient states the breathing treatment she got from the RT helped and so we will prescribe this as well.  Patient was given return precautions. Patient stable for discharge at this time.  Patient verbalized understanding of plan.  This chart was dictated using voice recognition  software.  Despite best efforts to proofread,  errors can occur which can change the documentation meaning.         Final Clinical Impression(s) / ED Diagnoses Final diagnoses:  Shortness of breath    Rx / DC Orders ED Discharge Orders          Ordered    albuterol (VENTOLIN HFA) 108 (90 Base) MCG/ACT inhaler  Every 6 hours PRN,   Status:  Discontinued        03/12/23 2140    albuterol (VENTOLIN HFA) 108 (90 Base) MCG/ACT inhaler  Every 6 hours PRN        03/12/23 2141              Remi Deter 03/12/23 2143    Benjiman Core, MD 03/12/23 2318

## 2023-03-12 NOTE — ED Triage Notes (Signed)
Sob with minimal activity. Getting worse Worse since last week Seen at [facility] referred for chest ct and labs

## 2023-03-13 ENCOUNTER — Other Ambulatory Visit: Payer: Self-pay | Admitting: Family Medicine

## 2023-03-13 DIAGNOSIS — Z1231 Encounter for screening mammogram for malignant neoplasm of breast: Secondary | ICD-10-CM

## 2023-05-13 ENCOUNTER — Ambulatory Visit
Admission: RE | Admit: 2023-05-13 | Discharge: 2023-05-13 | Disposition: A | Discharge status: Home / Self Care | Payer: 59 | Source: Ambulatory Visit | Attending: Family Medicine | Admitting: Family Medicine

## 2023-05-13 DIAGNOSIS — Z1231 Encounter for screening mammogram for malignant neoplasm of breast: Secondary | ICD-10-CM

## 2023-10-09 ENCOUNTER — Other Ambulatory Visit: Payer: Self-pay | Admitting: Nurse Practitioner

## 2023-10-09 ENCOUNTER — Ambulatory Visit
Admission: RE | Admit: 2023-10-09 | Discharge: 2023-10-09 | Disposition: A | Discharge status: Home / Self Care | Payer: Worker's Compensation | Source: Ambulatory Visit | Attending: Nurse Practitioner | Admitting: Nurse Practitioner

## 2023-10-09 DIAGNOSIS — M549 Dorsalgia, unspecified: Secondary | ICD-10-CM

## 2023-10-09 DIAGNOSIS — M545 Low back pain, unspecified: Secondary | ICD-10-CM

## 2023-12-12 ENCOUNTER — Ambulatory Visit (HOSPITAL_BASED_OUTPATIENT_CLINIC_OR_DEPARTMENT_OTHER): Payer: Worker's Compensation | Attending: Physician Assistant | Admitting: Physical Therapy

## 2023-12-12 ENCOUNTER — Other Ambulatory Visit: Payer: Self-pay

## 2023-12-12 ENCOUNTER — Encounter (HOSPITAL_BASED_OUTPATIENT_CLINIC_OR_DEPARTMENT_OTHER): Payer: Self-pay | Admitting: Physical Therapy

## 2023-12-12 DIAGNOSIS — R2689 Other abnormalities of gait and mobility: Secondary | ICD-10-CM | POA: Diagnosis present

## 2023-12-12 DIAGNOSIS — M5459 Other low back pain: Secondary | ICD-10-CM | POA: Diagnosis present

## 2023-12-12 DIAGNOSIS — M6281 Muscle weakness (generalized): Secondary | ICD-10-CM | POA: Diagnosis present

## 2023-12-12 DIAGNOSIS — R29898 Other symptoms and signs involving the musculoskeletal system: Secondary | ICD-10-CM | POA: Diagnosis present

## 2023-12-12 NOTE — Therapy (Signed)
 OUTPATIENT PHYSICAL THERAPY THORACOLUMBAR EVALUATION   Patient Name: Nicole Davila MRN: [id number] DOB:01-Feb-1961, 63 y.o., female Today's Date: 12/12/2023  END OF SESSION:  PT End of Session - 12/12/23 0806     Visit Number 1    Number of Visits 16    Date for PT Re-Evaluation 02/06/24    Authorization Type Worker's Comp    PT Start Time 0806    PT Stop Time 0844    PT Time Calculation (min) 38 min    Activity Tolerance Patient tolerated treatment well    Behavior During Therapy [facility] for tasks assessed/performed          History reviewed. No pertinent past medical history. History reviewed. No pertinent surgical history. Patient Active Problem List   Diagnosis Date Noted   Pain in joint, ankle and foot 07/05/2016   Metatarsalgia of right foot 07/05/2016   Abnormality of gait 07/05/2016    PCP: Nicole Harvey, MD   REFERRING PROVIDER: Candance Jeoffrey SAILOR, PA-C  REFERRING DIAG: M54.50 (ICD-10-CM) - Low back pain, unspecified  Rationale for Evaluation and Treatment: Rehabilitation  THERAPY DIAG:  Other low back pain  Muscle weakness (generalized)  Other abnormalities of gait and mobility  Other symptoms and signs involving the musculoskeletal system  ONSET DATE: June 2025  SUBJECTIVE:                                                                                                                                                                                           SUBJECTIVE STATEMENT: Patient states low back pain since beginning of June 2025. 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 Went to [facility] and they gave her some exercises. Had follow up at [facility] and they cleared her since she wasn't having pain at the time. Went to [facility] and they referred her for PT. Patient states no pain with walking. Woke up hurting more today. Sitting irritates it and she has to get up and move a lot. Pain mostly on the right but sometimes the  left. Mostly lower back but sometimes mid back. States heat pack helps. Member at [employer], mostly doing walking lately. She likes doing weights as well. Some R hip pain.   PERTINENT HISTORY:  CAD, GERD  PAIN:  Are you having pain? Yes: NPRS scale: 6-7/10 Pain location: low back Pain description: sharp Aggravating factors: lifting, standing, sitting Relieving factors: heat  PRECAUTIONS: None  WEIGHT BEARING RESTRICTIONS: No  FALLS:  Has patient fallen in last 6 months? No  OCCUPATION: librarian  PLOF: Independent  PATIENT GOALS: Wants to know what she can  do, Back to feel better   OBJECTIVE: (objective measures from initial evaluation unless otherwise dated)  PATIENT SURVEYS:  Modified Oswestry:  MODIFIED OSWESTRY DISABILITY SCALE  Date: 12/12/23 Score  Pain intensity 1 = The pain is bad, but I can manage without having to take (1) I can stand as long as I want but, it increases my pain. pain medication.  2. Personal care (washing, dressing, etc.) 0 =  I can take care of myself normally without causing increased pain.  3. Lifting 4 = I can lift only very light weights  4. Walking 1 = Pain prevents me from walking more than 1 mile.  5. Sitting 3 =  Pain prevents me from sitting more than  hour.  6. Standing 1 =  I can stand as long as I want but, it increases my pain.  7. Sleeping 0 = Pain does not prevent me from sleeping well.  8. Social Life 0 = My social life is normal and does not increase my pain.  9. Traveling 1 =  I can travel anywhere, but it increases my pain.  10. Employment/ Homemaking 1 = My normal homemaking/job activities increase my pain, but I can still perform all that is required of me  Total 12/50   Interpretation of scores: Score Category Description  0-20% Minimal Disability The patient can cope with most living activities. Usually no treatment is indicated apart from advice on lifting, sitting and exercise  21-40% Moderate Disability The patient  experiences more pain and difficulty with sitting, lifting and standing. Travel and social life are more difficult and they may be disabled from work. Personal care, sexual activity and sleeping are not grossly affected, and the patient can usually be managed by conservative means  41-60% Severe Disability Pain remains the main problem in this group, but activities of daily living are affected. These patients require a detailed investigation  61-80% Crippled Back pain impinges on all aspects of the patient's life. Positive intervention is required  81-100% Bed-bound  These patients are either bed-bound or exaggerating their symptoms  Nicole Davila Nicole Davila Nicole Davila, et al. Surgery versus conservative management of stable thoracolumbar fracture: the PRESTO feasibility RCT. [place]): [employer]; 2021 Nov. [facility] Technology Assessment, No. 25.62.) Appendix 3, Oswestry Disability Index category descriptors. Available from: FindJewelers.cz  Minimally Clinically Important Difference (MCID) = 12.8%  SCREENING FOR RED FLAGS: Bowel or bladder incontinence: No Spinal tumors: No Cauda equina syndrome: No Compression fracture: No Abdominal aneurysm: No  COGNITION: Overall cognitive status: Within functional limits for tasks assessed     SENSATION: WFL    POSTURE: decreased lumbar lordosis  PALPATION: Hypomobile grossly in thoracic and lumbar spine, tenderness mid thoracic and upper lumbar UPA; TTP bilateral but R>L paraspinals and QL, R glute tenderness; notes increased tenderness at R L5 paraspinals following testing  LUMBAR ROM: stretch with rotation bilateral  AROM eval  Flexion 0% limited  Extension 0% limited  Right lateral flexion 0% limited  Left lateral flexion 25% limited*  Right rotation 0% limited  Left rotation 0% limited   (Blank rows = not tested) * = pain/symptoms  LOWER EXTREMITY ROM:   WFL for tasks assessed  Active   Right eval Left eval  Hip flexion    Hip extension    Hip abduction    Hip adduction    Hip internal rotation    Hip external rotation    Knee flexion    Knee extension  Ankle dorsiflexion    Ankle plantarflexion    Ankle inversion    Ankle eversion     (Blank rows = not tested) * = pain/symptoms  LOWER EXTREMITY MMT:    MMT Right eval Left eval  Hip flexion 4+ 4+  Hip extension 4- 4-  Hip abduction 4- * 4-  Hip adduction    Hip internal rotation    Hip external rotation    Knee flexion 5 5  Knee extension 5 5  Ankle dorsiflexion 5 5  Ankle plantarflexion    Ankle inversion    Ankle eversion     (Blank rows = not tested) * = pain/symptoms    FUNCTIONAL TESTS:  5 times sit to stand: 9.57 seconds without UE support, mild L >R  Squat/lifting mechanics: fair mechanics  GAIT: Distance walked: 100 feet Assistive device utilized: None Level of assistance: Complete Independence Comments: Mild R compensated trendelenberg  TODAY'S TREATMENT:                                                                                                                              DATE:  12/12/23  Eval, education, HEP   PATIENT EDUCATION:  Education details: Patient educated on exam findings, POC, scope of PT, HEP, relevant anatomy/biomechanics, and lumbar pathology. Person educated: Patient Education method: Explanation, Demonstration, and Handouts Education comprehension: verbalized understanding, returned demonstration, verbal cues required, and tactile cues required  HOME EXERCISE PROGRAM: Access Code: [email or web address] URL: [email or web address] Date: 12/12/2023 Prepared by: Prentice Kaison Mcparland  Exercises - Supine Bridge  - 1 x daily - 7 x weekly - 2 sets - 10 reps - Supine Lower Trunk Rotation  - 2 x daily - 7 x weekly - 2 sets - 10 reps  ASSESSMENT:  CLINICAL IMPRESSION: Patient a 63 y.o. y.o. female who was seen today for physical therapy evaluation and  treatment for low back pain. Patient presents with pain limited deficits in lumbar spine and hip strength, ROM, endurance, activity tolerance, gait, and functional mobility with ADL. Patient is having to modify and restrict ADL as indicated by outcome measure score as well as subjective information and objective measures which is affecting overall participation. Patient will benefit from skilled physical therapy in order to improve function and reduce impairment.  OBJECTIVE IMPAIRMENTS: decreased activity tolerance, decreased balance, decreased endurance, decreased mobility, difficulty walking, decreased ROM, decreased strength, hypomobility, increased muscle spasms, impaired flexibility, improper body mechanics, postural dysfunction, and pain  ACTIVITY LIMITATIONS: carrying, lifting, bending, standing, squatting, stairs, transfers, bed mobility, reach over head, locomotion level, and caring for others  PARTICIPATION LIMITATIONS:  meal prep, cleaning, laundry, shopping, community activity, and yard work  PERSONAL FACTORS: Profession and Time since onset of injury/illness/exacerbation are also affecting patient's functional outcome.   REHAB POTENTIAL: Good  CLINICAL DECISION MAKING: Evolving/moderate complexity  EVALUATION COMPLEXITY: Moderate   GOALS: Goals reviewed with patient? Yes  SHORT TERM GOALS: Target date: 01/09/2024    Patient will be  independent with HEP in order to improve functional outcomes. Baseline:  Goal status: INITIAL  2.  Patient will report at least 25% improvement in symptoms for improved quality of life. Baseline:  Goal status: INITIAL    LONG TERM GOALS: Target date: 02/06/2024    Patient will report at least 75% improvement in symptoms for improved quality of life. Baseline:  Goal status: INITIAL  2.  Patient will improve Modified Oswestry score by at least 10 points in order to indicate improved tolerance to activity. Baseline: 12/50 Goal status:  INITIAL  3.  Patient will demonstrate full  lumbar ROM in all restricted planes for improved ability to move trunk while completing chores/work duties. Baseline:  Goal status: INITIAL  4.  Patient will be able to return to all activities unrestricted for improved ability to perform work functions and  exercise.  Baseline:  Goal status: INITIAL  5.  Patient will demonstrate grade of 5/5 MMT grade in all tested musculature as evidence of improved strength to assist with stair ambulation and gait.   Baseline:  Goal status: INITIAL     PLAN:  PT FREQUENCY: 1-2x/week  PT DURATION: 8 weeks  PLANNED INTERVENTIONS: [id number]- PT Re-evaluation, 97110-Therapeutic exercises, 97530- Therapeutic activity, [id number]- Neuromuscular re-education, 97535- Self Care, [id number]- Manual therapy, [id number]- Gait training, [phone]- Orthotic Fit/training, [phone]- Canalith repositioning, [id number]- Aquatic Therapy, [phone]- Splinting, [phone]- Wound care (first 20 sq cm), 97598- Wound care (each additional 20 sq cm)Patient/Family education, Balance training, Stair training, Taping, Dry Needling, Joint mobilization, Joint manipulation, Spinal manipulation, Spinal mobilization, Scar mobilization, and DME instructions.  PLAN FOR NEXT SESSION: f/u with HEP, hip and core strength, postural strength, spinal mobility, manual for pain/mobility   Prentice GORMAN Stains, PT, DPT 12/12/2023, 8:46 AM

## 2023-12-15 NOTE — Therapy (Signed)
 OUTPATIENT PHYSICAL THERAPY THORACOLUMBAR TREATMENT   Patient Name: Nicole Davila MRN: [id number] DOB:01/13/1961, 63 y.o., female Today's Date: 12/16/2023  END OF SESSION:  PT End of Session - 12/16/23 0808     Visit Number 2    Number of Visits 16    Date for Recertification  02/06/24    Authorization Type Worker's Comp    PT Start Time 0807    PT Stop Time 0851    PT Time Calculation (min) 44 min    Activity Tolerance Patient tolerated treatment well    Behavior During Therapy [facility] for tasks assessed/performed           History reviewed. No pertinent past medical history. History reviewed. No pertinent surgical history. Patient Active Problem List   Diagnosis Date Noted   Pain in joint, ankle and foot 07/05/2016   Metatarsalgia of right foot 07/05/2016   Abnormality of gait 07/05/2016    PCP: Aisha Harvey, MD   REFERRING PROVIDER: Candance Jeoffrey SAILOR, PA-C  REFERRING DIAG: M54.50 (ICD-10-CM) - Low back pain, unspecified  Rationale for Evaluation and Treatment: Rehabilitation  THERAPY DIAG:  Other low back pain  Muscle weakness (generalized)  Other symptoms and signs involving the musculoskeletal system  ONSET DATE: June 2025  SUBJECTIVE:                                                                                                                                                                                           SUBJECTIVE STATEMENT: Patient states she felt sore after prior treatment.  It doesn't take much to flare it up.  Pt reports her back was hurting all weekend.  Pt reports her pain travels up her back toward her shoulders and she becomes sore and tight.  Pt states she is ok with picking up 2 chrome books though has pain with 3 chrome books.  Pt reports she has been performing her HEP.  Pt is a member at [employer] and hasn't been walking as much lately due to work.    PERTINENT HISTORY:  CAD, GERD Restriction of lifting no > 10 lbs per PA  note  PAIN:  Are you having pain? Yes: NPRS scale: 6/10 Pain location: low back, R side > L side Pain description: sharp Aggravating factors: lifting, standing, sitting Relieving factors: heat  PRECAUTIONS: None  WEIGHT BEARING RESTRICTIONS: No  FALLS:  Has patient fallen in last 6 months? No  OCCUPATION: librarian  PLOF: Independent  PATIENT GOALS: Wants to know what she can do, Back to feel better   OBJECTIVE: (objective measures from initial evaluation unless otherwise dated)  PATIENT SURVEYS:  Modified  Oswestry:  MODIFIED OSWESTRY DISABILITY SCALE  Date: 12/12/23 Score  Pain intensity 1 = The pain is bad, but I can manage without having to take (1) I can stand as long as I want but, it increases my pain. pain medication.  2. Personal care (washing, dressing, etc.) 0 =  I can take care of myself normally without causing increased pain.  3. Lifting 4 = I can lift only very light weights  4. Walking 1 = Pain prevents me from walking more than 1 mile.  5. Sitting 3 =  Pain prevents me from sitting more than  hour.  6. Standing 1 =  I can stand as long as I want but, it increases my pain.  7. Sleeping 0 = Pain does not prevent me from sleeping well.  8. Social Life 0 = My social life is normal and does not increase my pain.  9. Traveling 1 =  I can travel anywhere, but it increases my pain.  10. Employment/ Homemaking 1 = My normal homemaking/job activities increase my pain, but I can still perform all that is required of me  Total 12/50   Interpretation of scores: Score Category Description  0-20% Minimal Disability The patient can cope with most living activities. Usually no treatment is indicated apart from advice on lifting, sitting and exercise  21-40% Moderate Disability The patient experiences more pain and difficulty with sitting, lifting and standing. Travel and social life are more difficult and they may be disabled from work. Personal care, sexual activity and  sleeping are not grossly affected, and the patient can usually be managed by conservative means  41-60% Severe Disability Pain remains the main problem in this group, but activities of daily living are affected. These patients require a detailed investigation  61-80% Crippled Back pain impinges on all aspects of the patient's life. Positive intervention is required  81-100% Bed-bound  These patients are either bed-bound or exaggerating their symptoms  Bluford FORBES [name], et al. Surgery versus conservative management of stable thoracolumbar fracture: the PRESTO feasibility RCT. [place] ([place]): [employer]; 2021 Nov. [facility] Technology Assessment, No. 25.62.) Appendix 3, Oswestry Disability Index category descriptors. Available from: [employer]Jewel[employer]cz  Minimally Clinically Important Difference (MCID) = 12.8%  SCREENING FOR RED FLAGS: Bowel or bladder incontinence: No Spinal tumors: No Cauda equina syndrome: No Compression fracture: No Abdominal aneurysm: No  COGNITION: Overall cognitive status: Within functional limits for tasks assessed     SENSATION: WFL    POSTURE: decreased lumbar lordosis  PALPATION: Hypomobile grossly in thoracic and lumbar spine, tenderness mid thoracic and upper lumbar UPA; TTP bilateral but R>L paraspinals and QL, R glute tenderness; notes increased tenderness at R L5 paraspinals following testing  LUMBAR ROM: stretch with rotation bilateral  AROM eval  Flexion 0% limited  Extension 0% limited  Right lateral flexion 0% limited  Left lateral flexion 25% limited*  Right rotation 0% limited  Left rotation 0% limited   (Blank rows = not tested) * = pain/symptoms  LOWER EXTREMITY ROM:   WFL for tasks assessed  Active  Right eval Left eval  Hip flexion    Hip extension    Hip abduction    Hip adduction    Hip internal rotation    Hip external rotation    Knee flexion    Knee extension    Ankle  dorsiflexion    Ankle plantarflexion    Ankle inversion    Ankle eversion     (  Blank rows = not tested) * = pain/symptoms  LOWER EXTREMITY MMT:    MMT Right eval Left eval  Hip flexion 4+ 4+  Hip extension 4- 4-  Hip abduction 4- * 4-  Hip adduction    Hip internal rotation    Hip external rotation    Knee flexion 5 5  Knee extension 5 5  Ankle dorsiflexion 5 5  Ankle plantarflexion    Ankle inversion    Ankle eversion     (Blank rows = not tested) * = pain/symptoms    FUNCTIONAL TESTS:  5 times sit to stand: 9.57 seconds without UE support, mild L >R  Squat/lifting mechanics: fair mechanics  GAIT: Distance walked: 100 feet Assistive device utilized: None Level of assistance: Complete Independence Comments: Mild R compensated trendelenberg  TODAY'S TREATMENT:                                                                                                                              DATE:  12/16/23: Reviewed pt presentation, response to prior treatment, HEP compliance, and pain level.  Reviewed HEP. Supine bridge 2x10 Supine lumbar rotation 2x10 PT educated pt in correct palpation and performance of TrA contraction.  Pt performed supine TrA contraction with and without 5 sec holds.  Supine march with TrA 2x10 Supine clams with TrA with RTB 2x10 Standing rows with RTB 2x10 with TrA Supine alt LE extension with TrA   PT updated HEP and gave pt a HEP handout.  PT educated pt in correct form and appropriate frequency.        PATIENT EDUCATION:  Education details:  exercise form, HEP, POC, scope of PT, relevant anatomy/biomechanics, and lumbar pathology.  PT answered pt's questions.   Person educated: Patient Education method: Explanation, Demonstration, and Handouts Education comprehension: verbalized understanding, returned demonstration, verbal cues required, and tactile cues required  HOME EXERCISE PROGRAM: Access Code: [email or web address] URL:  [email or web address] Date: 12/12/2023 Prepared by: Prentice Zaunegger  Exercises - Supine Bridge  - 1 x daily - 7 x weekly - 2 sets - 10 reps - Supine Lower Trunk Rotation  - 2 x daily - 7 x weekly - 2 sets - 10 reps  Updated HEP: - Supine Transversus Abdominis Bracing - Hands on Stomach  - 2 x daily - 7 x weekly - 2 sets - 10 reps - 5 seconds hold - Supine March  - 1 x daily - 7 x weekly - 2 sets - 10 reps - Supine Core Control with Leg Extension  - 1 x daily - 7 x weekly - 2 sets - 10 reps  ASSESSMENT:  CLINICAL IMPRESSION: PT instructed pt in exercises to improve LE and core strength and stabilization and lumbar mobility.  Pt performed exercises well with cuing and instruction in correct form.  PT reviewed HEP and updated HEP.  PT educated pt in using Medbridge and educated pt in correct form and appropriate frequency of exercises.  She reports feeling it in her R hip with supine clams and PT did not include that exercise with HEP today.  PT answered pt's questions satisfactorily.  She responded well to treatment having no increased pain after treatment.  Pt should benefit from skilled PT to reduce pain, improve core and LE strength, address goals, and assist in restoring PLOF.    OBJECTIVE IMPAIRMENTS: decreased activity tolerance, decreased balance, decreased endurance, decreased mobility, difficulty walking, decreased ROM, decreased strength, hypomobility, increased muscle spasms, impaired flexibility, improper body mechanics, postural dysfunction, and pain  ACTIVITY LIMITATIONS: carrying, lifting, bending, standing, squatting, stairs, transfers, bed mobility, reach over head, locomotion level, and caring for others  PARTICIPATION LIMITATIONS:  meal prep, cleaning, laundry, shopping, community activity, and yard work  PERSONAL FACTORS: Profession and Time since onset of injury/illness/exacerbation are also affecting patient's functional outcome.   REHAB POTENTIAL:  Good  CLINICAL DECISION MAKING: Evolving/moderate complexity  EVALUATION COMPLEXITY: Moderate   GOALS: Goals reviewed with patient? Yes  SHORT TERM GOALS: Target date: 01/09/2024    Patient will be independent with HEP in order to improve functional outcomes. Baseline:  Goal status: INITIAL  2.  Patient will report at least 25% improvement in symptoms for improved quality of life. Baseline:  Goal status: INITIAL    LONG TERM GOALS: Target date: 02/06/2024    Patient will report at least 75% improvement in symptoms for improved quality of life. Baseline:  Goal status: INITIAL  2.  Patient will improve Modified Oswestry score by at least 10 points in order to indicate improved tolerance to activity. Baseline: 12/50 Goal status: INITIAL  3.  Patient will demonstrate full  lumbar ROM in all restricted planes for improved ability to move trunk while completing chores/work duties. Baseline:  Goal status: INITIAL  4.  Patient will be able to return to all activities unrestricted for improved ability to perform work functions and  exercise.  Baseline:  Goal status: INITIAL  5.  Patient will demonstrate grade of 5/5 MMT grade in all tested musculature as evidence of improved strength to assist with stair ambulation and gait.   Baseline:  Goal status: INITIAL     PLAN:  PT FREQUENCY: 1-2x/week  PT DURATION: 8 weeks  PLANNED INTERVENTIONS: 97164- PT Re-evaluation, 97110-Therapeutic exercises, 97530- Therapeutic activity, [id number]- Neuromuscular re-education, 97535- Self Care, [id number]- Manual therapy, [id number]- Gait training, [phone]- Orthotic Fit/training, [phone]- Canalith repositioning, [id number]- Aquatic Therapy, [phone]- Splinting, [phone]- Wound care (first 20 sq cm), 97598- Wound care (each additional 20 sq cm)Patient/Family education, Balance training, Stair training, Taping, Dry Needling, Joint mobilization, Joint manipulation, Spinal manipulation, Spinal mobilization, Scar  mobilization, and DME instructions.  PLAN FOR NEXT SESSION: f/u with HEP, hip and core strength, postural strength, spinal mobility, manual for pain/mobility   Leigh Minerva III PT, DPT 12/16/23 9:27 AM

## 2023-12-16 ENCOUNTER — Ambulatory Visit (HOSPITAL_BASED_OUTPATIENT_CLINIC_OR_DEPARTMENT_OTHER): Payer: Worker's Compensation | Admitting: Physical Therapy

## 2023-12-16 ENCOUNTER — Encounter (HOSPITAL_BASED_OUTPATIENT_CLINIC_OR_DEPARTMENT_OTHER): Payer: Self-pay | Admitting: Physical Therapy

## 2023-12-16 DIAGNOSIS — M5459 Other low back pain: Secondary | ICD-10-CM | POA: Diagnosis not present

## 2023-12-16 DIAGNOSIS — R29898 Other symptoms and signs involving the musculoskeletal system: Secondary | ICD-10-CM

## 2023-12-16 DIAGNOSIS — M6281 Muscle weakness (generalized): Secondary | ICD-10-CM

## 2023-12-17 ENCOUNTER — Other Ambulatory Visit (HOSPITAL_COMMUNITY): Payer: Self-pay | Admitting: Family Medicine

## 2023-12-17 DIAGNOSIS — R4789 Other speech disturbances: Secondary | ICD-10-CM

## 2023-12-19 ENCOUNTER — Other Ambulatory Visit: Payer: Self-pay | Admitting: Family Medicine

## 2023-12-19 DIAGNOSIS — R4789 Other speech disturbances: Secondary | ICD-10-CM

## 2023-12-25 ENCOUNTER — Encounter (HOSPITAL_BASED_OUTPATIENT_CLINIC_OR_DEPARTMENT_OTHER): Payer: Self-pay | Admitting: Physical Therapy

## 2023-12-25 ENCOUNTER — Ambulatory Visit (HOSPITAL_BASED_OUTPATIENT_CLINIC_OR_DEPARTMENT_OTHER): Payer: Worker's Compensation | Attending: Physician Assistant | Admitting: Physical Therapy

## 2023-12-25 DIAGNOSIS — M6281 Muscle weakness (generalized): Secondary | ICD-10-CM | POA: Insufficient documentation

## 2023-12-25 DIAGNOSIS — R29898 Other symptoms and signs involving the musculoskeletal system: Secondary | ICD-10-CM | POA: Insufficient documentation

## 2023-12-25 DIAGNOSIS — M5459 Other low back pain: Secondary | ICD-10-CM | POA: Diagnosis present

## 2023-12-25 NOTE — Therapy (Signed)
 OUTPATIENT PHYSICAL THERAPY THORACOLUMBAR TREATMENT   Patient Name: Nicole Davila MRN: [id number] DOB:[birth year], 63 y.o., female Today's Date: 12/26/2023  END OF SESSION:  PT End of Session - 12/25/23 0911     Visit Number 3    Number of Visits 16    Date for Recertification  02/06/24    Authorization Type Worker's Comp    PT Start Time 0907    PT Stop Time 0945    PT Time Calculation (min) 38 min    Activity Tolerance Patient tolerated treatment well    Behavior During Therapy [facility] for tasks assessed/performed           History reviewed. No pertinent past medical history. History reviewed. No pertinent surgical history. Patient Active Problem List   Diagnosis Date Noted   Pain in joint, ankle and foot 07/05/2016   Metatarsalgia of right foot 07/05/2016   Abnormality of gait 07/05/2016    PCP: Aisha Harvey, MD   REFERRING PROVIDER: Candance Jeoffrey SAILOR, PA-C  REFERRING DIAG: M54.50 (ICD-10-CM) - Low back pain, unspecified  Rationale for Evaluation and Treatment: Rehabilitation  THERAPY DIAG:  Other low back pain  Muscle weakness (generalized)  Other symptoms and signs involving the musculoskeletal system  ONSET DATE: June 2025  SUBJECTIVE:                                                                                                                                                                                           SUBJECTIVE STATEMENT: Patient has been performing the exercises and states the exercises have been good.  She states she is feeling better today than when she was here last visit.  She had to use heat some nights.  Pt is trying not to pick up much at work.  Pt denies any adverse effects after prior treatment.     Pt is a member at [employer] and hasn't been walking as much lately due to work.    PERTINENT HISTORY:  CAD, GERD Restriction of lifting no > 10 lbs per PA note  PAIN:  Are you having pain? Yes: NPRS scale: 4/10 Pain location: R  sided upper lumbar Pain description: sharp Aggravating factors: lifting, standing, sitting Relieving factors: heat  PRECAUTIONS: None  WEIGHT BEARING RESTRICTIONS: No  FALLS:  Has patient fallen in last 6 months? No  OCCUPATION: librarian  PLOF: Independent  PATIENT GOALS: Wants to know what she can do, Back to feel better   OBJECTIVE: (objective measures from initial evaluation unless otherwise dated)  PATIENT SURVEYS:  Modified Oswestry:  MODIFIED OSWESTRY DISABILITY SCALE  Date: 12/12/23 Score  Pain intensity 1 = The  pain is bad, but I can manage without having to take (1) I can stand as long as I want but, it increases my pain. pain medication.  2. Personal care (washing, dressing, etc.) 0 =  I can take care of myself normally without causing increased pain.  3. Lifting 4 = I can lift only very light weights  4. Walking 1 = Pain prevents me from walking more than 1 mile.  5. Sitting 3 =  Pain prevents me from sitting more than  hour.  6. Standing 1 =  I can stand as long as I want but, it increases my pain.  7. Sleeping 0 = Pain does not prevent me from sleeping well.  8. Social Life 0 = My social life is normal and does not increase my pain.  9. Traveling 1 =  I can travel anywhere, but it increases my pain.  10. Employment/ Homemaking 1 = My normal homemaking/job activities increase my pain, but I can still perform all that is required of me  Total 12/50   Interpretation of scores: Score Category Description  0-20% Minimal Disability The patient can cope with most living activities. Usually no treatment is indicated apart from advice on lifting, sitting and exercise  21-40% Moderate Disability The patient experiences more pain and difficulty with sitting, lifting and standing. Travel and social life are more difficult and they may be disabled from work. Personal care, sexual activity and sleeping are not grossly affected, and the patient can usually be managed by  conservative means  41-60% Severe Disability Pain remains the main problem in this group, but activities of daily living are affected. These patients require a detailed investigation  61-80% Crippled Back pain impinges on all aspects of the patient's life. Positive intervention is required  81-100% Bed-bound  These patients are either bed-bound or exaggerating their symptoms  Bluford FORBES [name], et al. Surgery versus conservative management of stable thoracolumbar fracture: the PRESTO feasibility RCT. [place] ([place]): [employer]; 2021 Nov. [facility] Technology Assessment, No. 25.62.) Appendix 3, Oswestry Disability Index category descriptors. Available from: [employer]Jewel[employer].cz  Minimally Clinically Important Difference (MCID) = 12.8%  SCREENING FOR RED FLAGS: Bowel or bladder incontinence: No Spinal tumors: No Cauda equina syndrome: No Compression fracture: No Abdominal aneurysm: No  COGNITION: Overall cognitive status: Within functional limits for tasks assessed     SENSATION: WFL    POSTURE: decreased lumbar lordosis  PALPATION: Hypomobile grossly in thoracic and lumbar spine, tenderness mid thoracic and upper lumbar UPA; TTP bilateral but R>L paraspinals and QL, R glute tenderness; notes increased tenderness at R L5 paraspinals following testing  LUMBAR ROM: stretch with rotation bilateral  AROM eval  Flexion 0% limited  Extension 0% limited  Right lateral flexion 0% limited  Left lateral flexion 25% limited*  Right rotation 0% limited  Left rotation 0% limited   (Blank rows = not tested) * = pain/symptoms  LOWER EXTREMITY ROM:   WFL for tasks assessed  Active  Right eval Left eval  Hip flexion    Hip extension    Hip abduction    Hip adduction    Hip internal rotation    Hip external rotation    Knee flexion    Knee extension    Ankle dorsiflexion    Ankle plantarflexion    Ankle inversion    Ankle eversion      (Blank rows = not tested) * = pain/symptoms  LOWER EXTREMITY MMT:    MMT  Right eval Left eval  Hip flexion 4+ 4+  Hip extension 4- 4-  Hip abduction 4- * 4-  Hip adduction    Hip internal rotation    Hip external rotation    Knee flexion 5 5  Knee extension 5 5  Ankle dorsiflexion 5 5  Ankle plantarflexion    Ankle inversion    Ankle eversion     (Blank rows = not tested) * = pain/symptoms    FUNCTIONAL TESTS:  5 times sit to stand: 9.57 seconds without UE support, mild L >R  Squat/lifting mechanics: fair mechanics  GAIT: Distance walked: 100 feet Assistive device utilized: None Level of assistance: Complete Independence Comments: Mild R compensated trendelenberg  TODAY'S TREATMENT:                                                                                                                              DATE:  12/25/23: Reviewed pt presentation, response to prior treatment, HEP compliance, and pain level.  Reviewed HEP.  Supine march with TrA x10 Supine alt UE/LE x10  Supine clams with TrA with RTB 2x10 Standing rows with RTB 2x10 with TrA Supine alt LE extension with TrA x 10 reps, and with UE's holding ball x 10 reps  Manual Therapy:  STM to R sided lumbar paraspinals in prone.   PT updated HEP and gave pt a HEP handout.  PT educated pt in correct form and appropriate frequency.        PATIENT EDUCATION:  Education details:  exercise form, HEP, POC, scope of PT, relevant anatomy/biomechanics, and lumbar pathology.  PT answered pt's questions including rationale of using ice vs heat. Person educated: Patient Education method: Explanation, Demonstration, and Handouts Education comprehension: verbalized understanding, returned demonstration, verbal cues required, and tactile cues required  HOME EXERCISE PROGRAM: Access Code: [email or web address] URL: [email or web address] Date: 12/12/2023 Prepared by: Prentice Zaunegger  Exercises - Supine Bridge   - 1 x daily - 7 x weekly - 2 sets - 10 reps - Supine Lower Trunk Rotation  - 2 x daily - 7 x weekly - 2 sets - 10 reps - Supine Transversus Abdominis Bracing - Hands on Stomach  - 2 x daily - 7 x weekly - 2 sets - 10 reps - 5 seconds hold - Supine March  - 1 x daily - 7 x weekly - 2 sets - 10 reps - Supine Core Control with Leg Extension  - 1 x daily - 7 x weekly - 2 sets - 10 reps  Updated HEP: - Hooklying Clamshell with Resistance  - 1 x daily - 5 x weekly - 2 sets - 10 reps - Standing Shoulder Row with Anchored Resistance  - 1 x daily - 4-5 x weekly - 2 sets - 10 reps  ASSESSMENT:  CLINICAL IMPRESSION: PT instructed pt in exercises to improve LE, postural, and core strength and stabilization.  Pt performed exercises well with cuing and instruction in correct  form.  She had no pain or discomfort in hips with supine clams.  PT progressed core exercises and pt tolerated exercises well.  PT reviewed HEP and updated HEP.  PT performed STM to R sided lumbar paraspinals to improve soft tissue tightness and mobility and to reduce pain and myofascial restrictions.  She responded well to treatment having no increased pain after treatment.  Pt should benefit from skilled PT to reduce pain, improve core and LE strength, address goals, and assist in restoring PLOF.    OBJECTIVE IMPAIRMENTS: decreased activity tolerance, decreased balance, decreased endurance, decreased mobility, difficulty walking, decreased ROM, decreased strength, hypomobility, increased muscle spasms, impaired flexibility, improper body mechanics, postural dysfunction, and pain  ACTIVITY LIMITATIONS: carrying, lifting, bending, standing, squatting, stairs, transfers, bed mobility, reach over head, locomotion level, and caring for others  PARTICIPATION LIMITATIONS:  meal prep, cleaning, laundry, shopping, community activity, and yard work  PERSONAL FACTORS: Profession and Time since onset of injury/illness/exacerbation are also  affecting patient's functional outcome.   REHAB POTENTIAL: Good  CLINICAL DECISION MAKING: Evolving/moderate complexity  EVALUATION COMPLEXITY: Moderate   GOALS: Goals reviewed with patient? Yes  SHORT TERM GOALS: Target date: 01/09/2024    Patient will be independent with HEP in order to improve functional outcomes. Baseline:  Goal status: INITIAL  2.  Patient will report at least 25% improvement in symptoms for improved quality of life. Baseline:  Goal status: INITIAL    LONG TERM GOALS: Target date: 02/06/2024    Patient will report at least 75% improvement in symptoms for improved quality of life. Baseline:  Goal status: INITIAL  2.  Patient will improve Modified Oswestry score by at least 10 points in order to indicate improved tolerance to activity. Baseline: 12/50 Goal status: INITIAL  3.  Patient will demonstrate full  lumbar ROM in all restricted planes for improved ability to move trunk while completing chores/work duties. Baseline:  Goal status: INITIAL  4.  Patient will be able to return to all activities unrestricted for improved ability to perform work functions and  exercise.  Baseline:  Goal status: INITIAL  5.  Patient will demonstrate grade of 5/5 MMT grade in all tested musculature as evidence of improved strength to assist with stair ambulation and gait.   Baseline:  Goal status: INITIAL     PLAN:  PT FREQUENCY: 1-2x/week  PT DURATION: 8 weeks  PLANNED INTERVENTIONS: 97164- PT Re-evaluation, 97110-Therapeutic exercises, 97530- Therapeutic activity, [id number]- Neuromuscular re-education, 97535- Self Care, 02859- Manual therapy, [id number]- Gait training, [phone]- Orthotic Fit/training, [phone]- Canalith repositioning, [id number]- Aquatic Therapy, [phone]- Splinting, [phone]- Wound care (first 20 sq cm), 97598- Wound care (each additional 20 sq cm)Patient/Family education, Balance training, Stair training, Taping, Dry Needling, Joint mobilization, Joint  manipulation, Spinal manipulation, Spinal mobilization, Scar mobilization, and DME instructions.  PLAN FOR NEXT SESSION: f/u with HEP, hip and core strength, postural strength, spinal mobility, manual for pain/mobility   Leigh Minerva III PT, DPT 12/26/23 5:02 PM

## 2023-12-31 ENCOUNTER — Encounter (HOSPITAL_BASED_OUTPATIENT_CLINIC_OR_DEPARTMENT_OTHER): Payer: Self-pay | Admitting: Physical Therapy

## 2023-12-31 ENCOUNTER — Ambulatory Visit (HOSPITAL_BASED_OUTPATIENT_CLINIC_OR_DEPARTMENT_OTHER): Payer: Worker's Compensation | Attending: Physician Assistant | Admitting: Physical Therapy

## 2023-12-31 DIAGNOSIS — R29898 Other symptoms and signs involving the musculoskeletal system: Secondary | ICD-10-CM | POA: Diagnosis present

## 2023-12-31 DIAGNOSIS — M5459 Other low back pain: Secondary | ICD-10-CM | POA: Insufficient documentation

## 2023-12-31 DIAGNOSIS — R2689 Other abnormalities of gait and mobility: Secondary | ICD-10-CM | POA: Diagnosis present

## 2023-12-31 DIAGNOSIS — M6281 Muscle weakness (generalized): Secondary | ICD-10-CM | POA: Diagnosis present

## 2023-12-31 NOTE — Therapy (Addendum)
 OUTPATIENT PHYSICAL THERAPY THORACOLUMBAR TREATMENT   Patient Name: Nicole Davila MRN: [id number] DOB:1960/06/28, 63 y.o., female Today's Date: 12/31/2023  END OF SESSION:  PT End of Session - 12/31/23 1523     Visit Number 4    Number of Visits 16    Date for Recertification  02/06/24    Authorization Type Worker's Comp    PT Start Time 1523   late arrival   PT Stop Time 1559    PT Time Calculation (min) 36 min    Activity Tolerance Patient tolerated treatment well    Behavior During Therapy [facility] for tasks assessed/performed           History reviewed. No pertinent past medical history. History reviewed. No pertinent surgical history. Patient Active Problem List   Diagnosis Date Noted   Pain in joint, ankle and foot 07/05/2016   Metatarsalgia of right foot 07/05/2016   Abnormality of gait 07/05/2016    PCP: Aisha Harvey, MD   REFERRING PROVIDER: Candance Jeoffrey SAILOR, PA-C  REFERRING DIAG: M54.50 (ICD-10-CM) - Low back pain, unspecified  Rationale for Evaluation and Treatment: Rehabilitation  THERAPY DIAG:  Other low back pain  Muscle weakness (generalized)  Other symptoms and signs involving the musculoskeletal system  Other abnormalities of gait and mobility  ONSET DATE: June 2025  SUBJECTIVE:                                                                                                                                                                                           SUBJECTIVE STATEMENT: Doing okay today. Reports back is feeling okay. Exercises at home have been well. States she is walking after therapy.   Pt is a member at [employer] and hasn't been walking as much lately due to work.    PERTINENT HISTORY:  CAD, GERD Restriction of lifting no > 10 lbs per PA note  PAIN:  Are you having pain? Yes: NPRS scale: 4/10 Pain location: R sided upper lumbar Pain description: sharp Aggravating factors: lifting, standing, sitting Relieving factors:  heat  PRECAUTIONS: None  WEIGHT BEARING RESTRICTIONS: No  FALLS:  Has patient fallen in last 6 months? No  OCCUPATION: librarian  PLOF: Independent  PATIENT GOALS: Wants to know what she can do, Back to feel better   OBJECTIVE: (objective measures from initial evaluation unless otherwise dated)  PATIENT SURVEYS:  Modified Oswestry:  MODIFIED OSWESTRY DISABILITY SCALE  Date: 12/12/23 Score  Pain intensity 1 = The pain is bad, but I can manage without having to take (1) I can stand as long as I want but, it increases my pain. pain medication.  2. Personal care (washing, dressing, etc.) 0 =  I can take care of myself normally without causing increased pain.  3. Lifting 4 = I can lift only very light weights  4. Walking 1 = Pain prevents me from walking more than 1 mile.  5. Sitting 3 =  Pain prevents me from sitting more than  hour.  6. Standing 1 =  I can stand as long as I want but, it increases my pain.  7. Sleeping 0 = Pain does not prevent me from sleeping well.  8. Social Life 0 = My social life is normal and does not increase my pain.  9. Traveling 1 =  I can travel anywhere, but it increases my pain.  10. Employment/ Homemaking 1 = My normal homemaking/job activities increase my pain, but I can still perform all that is required of me  Total 12/50   Interpretation of scores: Score Category Description  0-20% Minimal Disability The patient can cope with most living activities. Usually no treatment is indicated apart from advice on lifting, sitting and exercise  21-40% Moderate Disability The patient experiences more pain and difficulty with sitting, lifting and standing. Travel and social life are more difficult and they may be disabled from work. Personal care, sexual activity and sleeping are not grossly affected, and the patient can usually be managed by conservative means  41-60% Severe Disability Pain remains the main problem in this group, but activities of daily  living are affected. These patients require a detailed investigation  61-80% Crippled Back pain impinges on all aspects of the patient's life. Positive intervention is required  81-100% Bed-bound  These patients are either bed-bound or exaggerating their symptoms  Bluford FORBES [name], et al. Surgery versus conservative management of stable thoracolumbar fracture: the PRESTO feasibility RCT. [place] ([place]): [employer]; 2021 Nov. [facility] Technology Assessment, No. 25.62.) Appendix 3, Oswestry Disability Index category descriptors. Available from: [employer]el[employer].cz  Minimally Clinically Important Difference (MCID) = 12.8%  SCREENING FOR RED FLAGS: Bowel or bladder incontinence: No Spinal tumors: No Cauda equina syndrome: No Compression fracture: No Abdominal aneurysm: No  COGNITION: Overall cognitive status: Within functional limits for tasks assessed     SENSATION: WFL    POSTURE: decreased lumbar lordosis  PALPATION: Hypomobile grossly in thoracic and lumbar spine, tenderness mid thoracic and upper lumbar UPA; TTP bilateral but R>L paraspinals and QL, R glute tenderness; notes increased tenderness at R L5 paraspinals following testing  LUMBAR ROM: stretch with rotation bilateral  AROM eval  Flexion 0% limited  Extension 0% limited  Right lateral flexion 0% limited  Left lateral flexion 25% limited*  Right rotation 0% limited  Left rotation 0% limited   (Blank rows = not tested) * = pain/symptoms  LOWER EXTREMITY ROM:   WFL for tasks assessed  Active  Right eval Left eval  Hip flexion    Hip extension    Hip abduction    Hip adduction    Hip internal rotation    Hip external rotation    Knee flexion    Knee extension    Ankle dorsiflexion    Ankle plantarflexion    Ankle inversion    Ankle eversion     (Blank rows = not tested) * = pain/symptoms  LOWER EXTREMITY MMT:    MMT Right eval Left eval  Hip  flexion 4+ 4+  Hip extension 4- 4-  Hip abduction 4- * 4-  Hip adduction    Hip internal  rotation    Hip external rotation    Knee flexion 5 5  Knee extension 5 5  Ankle dorsiflexion 5 5  Ankle plantarflexion    Ankle inversion    Ankle eversion     (Blank rows = not tested) * = pain/symptoms    FUNCTIONAL TESTS:  5 times sit to stand: 9.57 seconds without UE support, mild L >R  Squat/lifting mechanics: fair mechanics  GAIT: Distance walked: 100 feet Assistive device utilized: None Level of assistance: Complete Independence Comments: Mild R compensated trendelenberg  TODAY'S TREATMENT:                                                                                                                              DATE:  12/31/23 SciFit LE bike 2 min lvl 4  STM to right lunmbar paraspinals  Anterior glide at PSIS grade II  Dead bugs x10 Standing lunge with RLE on table x10 Lumbar stretch on ball 2x10 Scapular squeezes 2x10   12/25/23: Reviewed pt presentation, response to prior treatment, HEP compliance, and pain level.  Reviewed HEP.  Supine march with TrA x10 Supine alt UE/LE x10  Supine clams with TrA with RTB 2x10 Standing rows with RTB 2x10 with TrA Supine alt LE extension with TrA x 10 reps, and with UE's holding ball x 10 reps  Manual Therapy:  STM to R sided lumbar paraspinals in prone.   PT updated HEP and gave pt a HEP handout.  PT educated pt in correct form and appropriate frequency.     PATIENT EDUCATION:  Education details:  exercise form, HEP, POC, scope of PT, relevant anatomy/biomechanics, and lumbar pathology.  PT answered pt's questions including rationale of using ice vs heat. Person educated: Patient Education method: Explanation, Demonstration, and Handouts Education comprehension: verbalized understanding, returned demonstration, verbal cues required, and tactile cues required  HOME EXERCISE PROGRAM: Access Code: [email or web address] URL:  [email or web address] Date: 12/12/2023 Prepared by: Prentice Zaunegger  Exercises - Supine Bridge  - 1 x daily - 7 x weekly - 2 sets - 10 reps - Supine Lower Trunk Rotation  - 2 x daily - 7 x weekly - 2 sets - 10 reps - Supine Transversus Abdominis Bracing - Hands on Stomach  - 2 x daily - 7 x weekly - 2 sets - 10 reps - 5 seconds hold - Supine March  - 1 x daily - 7 x weekly - 2 sets - 10 reps - Supine Core Control with Leg Extension  - 1 x daily - 7 x weekly - 2 sets - 10 reps  Updated HEP: - Hooklying Clamshell with Resistance  - 1 x daily - 5 x weekly - 2 sets - 10 reps - Standing Shoulder Row with Anchored Resistance  - 1 x daily - 4-5 x weekly - 2 sets - 10 reps  ASSESSMENT:  CLINICAL IMPRESSION: Patients back flared up on SciFit bike. Interventions focused on pain management and increasing  activity tolerance. Manual therapy utilized to decrease hyperactive musculature. Exercises focused on stretching low back to alleviate pain. Verbal cueing throughout session for body mechanics to decrease anterior pelvic tilt and improve core activation. Educated patient on self-STM at home and activity modification. Will continue to benefit from therapy to address remaining limitations and return to prior level of function.   OBJECTIVE IMPAIRMENTS: decreased activity tolerance, decreased balance, decreased endurance, decreased mobility, difficulty walking, decreased ROM, decreased strength, hypomobility, increased muscle spasms, impaired flexibility, improper body mechanics, postural dysfunction, and pain  ACTIVITY LIMITATIONS: carrying, lifting, bending, standing, squatting, stairs, transfers, bed mobility, reach over head, locomotion level, and caring for others  PARTICIPATION LIMITATIONS:  meal prep, cleaning, laundry, shopping, community activity, and yard work  PERSONAL FACTORS: Profession and Time since onset of injury/illness/exacerbation are also affecting patient's functional  outcome.   REHAB POTENTIAL: Good  CLINICAL DECISION MAKING: Evolving/moderate complexity  EVALUATION COMPLEXITY: Moderate   GOALS: Goals reviewed with patient? Yes  SHORT TERM GOALS: Target date: 01/09/2024    Patient will be independent with HEP in order to improve functional outcomes. Baseline:  Goal status: INITIAL  2.  Patient will report at least 25% improvement in symptoms for improved quality of life. Baseline:  Goal status: INITIAL    LONG TERM GOALS: Target date: 02/06/2024    Patient will report at least 75% improvement in symptoms for improved quality of life. Baseline:  Goal status: INITIAL  2.  Patient will improve Modified Oswestry score by at least 10 points in order to indicate improved tolerance to activity. Baseline: 12/50 Goal status: INITIAL  3.  Patient will demonstrate full  lumbar ROM in all restricted planes for improved ability to move trunk while completing chores/work duties. Baseline:  Goal status: INITIAL  4.  Patient will be able to return to all activities unrestricted for improved ability to perform work functions and  exercise.  Baseline:  Goal status: INITIAL  5.  Patient will demonstrate grade of 5/5 MMT grade in all tested musculature as evidence of improved strength to assist with stair ambulation and gait.   Baseline:  Goal status: INITIAL   PLAN:  PT FREQUENCY: 1-2x/week  PT DURATION: 8 weeks  PLANNED INTERVENTIONS: [id number]- PT Re-evaluation, 97110-Therapeutic exercises, 97530- Therapeutic activity, [id number]- Neuromuscular re-education, 97535- Self Care, [id number]- Manual therapy, [id number]- Gait training, [phone]- Orthotic Fit/training, [phone]- Canalith repositioning, [id number]- Aquatic Therapy, [phone]- Splinting, [phone]- Wound care (first 20 sq cm), 97598- Wound care (each additional 20 sq cm)Patient/Family education, Balance training, Stair training, Taping, Dry Needling, Joint mobilization, Joint manipulation, Spinal manipulation, Spinal  mobilization, Scar mobilization, and DME instructions.  PLAN FOR NEXT SESSION: f/u with HEP, hip and core strength, postural strength, spinal mobility, manual for pain/mobility   Lili Finder, Student-PT 12/31/2023, 3:59 PM  This entire session was performed under direct supervision and direction of a licensed therapist/therapist assistant . I have personally read, edited and approve of the note as written. 4:07 PM, 12/31/23 Prentice CANDIE Stains PT, DPT Physical Therapist at [facility]

## 2024-01-02 ENCOUNTER — Other Ambulatory Visit

## 2024-01-03 ENCOUNTER — Encounter (HOSPITAL_BASED_OUTPATIENT_CLINIC_OR_DEPARTMENT_OTHER): Payer: Self-pay | Admitting: Physical Therapy

## 2024-01-03 ENCOUNTER — Ambulatory Visit (HOSPITAL_BASED_OUTPATIENT_CLINIC_OR_DEPARTMENT_OTHER): Payer: Worker's Compensation | Admitting: Physical Therapy

## 2024-01-03 DIAGNOSIS — M5459 Other low back pain: Secondary | ICD-10-CM

## 2024-01-03 DIAGNOSIS — R29898 Other symptoms and signs involving the musculoskeletal system: Secondary | ICD-10-CM

## 2024-01-03 DIAGNOSIS — M6281 Muscle weakness (generalized): Secondary | ICD-10-CM

## 2024-01-03 NOTE — Therapy (Signed)
 OUTPATIENT PHYSICAL THERAPY THORACOLUMBAR TREATMENT   Patient Name: ASSYRIA MORREALE MRN: [id number] DOB:29-Jun-1960, 63 y.o., female Today's Date: 01/03/2024  END OF SESSION:  PT End of Session - 01/03/24 0826     Visit Number 5    Number of Visits 16    Date for Recertification  02/06/24    Authorization Type Worker's Comp    PT Start Time 0803    PT Stop Time 0849    PT Time Calculation (min) 46 min    Activity Tolerance Patient tolerated treatment well    Behavior During Therapy [facility] for tasks assessed/performed            History reviewed. No pertinent past medical history. History reviewed. No pertinent surgical history. Patient Active Problem List   Diagnosis Date Noted   Pain in joint, ankle and foot 07/05/2016   Metatarsalgia of right foot 07/05/2016   Abnormality of gait 07/05/2016    PCP: Aisha Harvey, MD   REFERRING PROVIDER: Candance Jeoffrey SAILOR, PA-C  REFERRING DIAG: M54.50 (ICD-10-CM) - Low back pain, unspecified  Rationale for Evaluation and Treatment: Rehabilitation  THERAPY DIAG:  Other low back pain  Muscle weakness (generalized)  Other symptoms and signs involving the musculoskeletal system  ONSET DATE: June 2025  SUBJECTIVE:                                                                                                                                                                                           SUBJECTIVE STATEMENT: PT has been helping.  Pt reports compliance with HEP.  Pt has walked 4 times this week, about 30 mins each time.  Pt reports having increased pain during and after prior treatment.  She had difficulty sleeping that night.  She has had increased pain since, but is feeling better today.    Pt is a member at [employer].  PERTINENT HISTORY:  CAD, GERD Restriction of lifting no > 10 lbs per PA note  PAIN:  Are you having pain? Yes: NPRS scale: 5/10 Pain location: R sided lumbar Pain description: sharp Aggravating  factors: lifting, standing, sitting Relieving factors: heat  PRECAUTIONS: None  WEIGHT BEARING RESTRICTIONS: No  FALLS:  Has patient fallen in last 6 months? No  OCCUPATION: librarian  PLOF: Independent  PATIENT GOALS: Wants to know what she can do, Back to feel better   OBJECTIVE: (objective measures from initial evaluation unless otherwise dated)  PATIENT SURVEYS:  Modified Oswestry:  MODIFIED OSWESTRY DISABILITY SCALE  Date: 12/12/23 Score  Pain intensity 1 = The pain is bad, but I can manage without having to take (1) I can stand as  long as I want but, it increases my pain. pain medication.  2. Personal care (washing, dressing, etc.) 0 =  I can take care of myself normally without causing increased pain.  3. Lifting 4 = I can lift only very light weights  4. Walking 1 = Pain prevents me from walking more than 1 mile.  5. Sitting 3 =  Pain prevents me from sitting more than  hour.  6. Standing 1 =  I can stand as long as I want but, it increases my pain.  7. Sleeping 0 = Pain does not prevent me from sleeping well.  8. Social Life 0 = My social life is normal and does not increase my pain.  9. Traveling 1 =  I can travel anywhere, but it increases my pain.  10. Employment/ Homemaking 1 = My normal homemaking/job activities increase my pain, but I can still perform all that is required of me  Total 12/50   Interpretation of scores: Score Category Description  0-20% Minimal Disability The patient can cope with most living activities. Usually no treatment is indicated apart from advice on lifting, sitting and exercise  21-40% Moderate Disability The patient experiences more pain and difficulty with sitting, lifting and standing. Travel and social life are more difficult and they may be disabled from work. Personal care, sexual activity and sleeping are not grossly affected, and the patient can usually be managed by conservative means  41-60% Severe Disability Pain remains the  main problem in this group, but activities of daily living are affected. These patients require a detailed investigation  61-80% Crippled Back pain impinges on all aspects of the patient's life. Positive intervention is required  81-100% Bed-bound  These patients are either bed-bound or exaggerating their symptoms  Bluford FORBES [name], et al. Surgery versus conservative management of stable thoracolumbar fracture: the PRESTO feasibility RCT. [place] ([place]): [employer]; 2021 Nov. [facility] Technology Assessment, No. 25.62.) Appendix 3, Oswestry Disability Index category descriptors. Available from: Find[employer]el[employer].cz  Minimally Clinically Important Difference (MCID) = 12.8%  SCREENING FOR RED FLAGS: Bowel or bladder incontinence: No Spinal tumors: No Cauda equina syndrome: No Compression fracture: No Abdominal aneurysm: No  COGNITION: Overall cognitive status: Within functional limits for tasks assessed     SENSATION: WFL    POSTURE: decreased lumbar lordosis  PALPATION: Hypomobile grossly in thoracic and lumbar spine, tenderness mid thoracic and upper lumbar UPA; TTP bilateral but R>L paraspinals and QL, R glute tenderness; notes increased tenderness at R L5 paraspinals following testing  LUMBAR ROM: stretch with rotation bilateral  AROM eval  Flexion 0% limited  Extension 0% limited  Right lateral flexion 0% limited  Left lateral flexion 25% limited*  Right rotation 0% limited  Left rotation 0% limited   (Blank rows = not tested) * = pain/symptoms  LOWER EXTREMITY ROM:   WFL for tasks assessed  Active  Right eval Left eval  Hip flexion    Hip extension    Hip abduction    Hip adduction    Hip internal rotation    Hip external rotation    Knee flexion    Knee extension    Ankle dorsiflexion    Ankle plantarflexion    Ankle inversion    Ankle eversion     (Blank rows = not tested) * = pain/symptoms  LOWER  EXTREMITY MMT:    MMT Right eval Left eval  Hip flexion 4+ 4+  Hip extension 4- 4-  Hip  abduction 4- * 4-  Hip adduction    Hip internal rotation    Hip external rotation    Knee flexion 5 5  Knee extension 5 5  Ankle dorsiflexion 5 5  Ankle plantarflexion    Ankle inversion    Ankle eversion     (Blank rows = not tested) * = pain/symptoms    FUNCTIONAL TESTS:  5 times sit to stand: 9.57 seconds without UE support, mild L >R  Squat/lifting mechanics: fair mechanics  GAIT: Distance walked: 100 feet Assistive device utilized: None Level of assistance: Complete Independence Comments: Mild R compensated trendelenberg  TODAY'S TREATMENT:                                                                                                                              DATE:  01/03/24 Reviewed pt presentation, response to prior treatment, HEP compliance, and pain level.   Supine alt UE/LE 2x10 Supine alt LE extension with TrA 2x10 reps, and with UE's holding 1.1# ball x 10 reps Supine clams with TrA with RTB x 15, with GTB x10  Supine SLR with TrA x10 each Supine modified thomas stretch 3x20 sec bilat Standing rows with RTB x10 with TrA  Updated HEP.  Pt received a HEP handout and was educated in correct form and appropriate frequency.  PT instructed pt she should not have pain with stretching.  Manual Therapy:  STM to R sided lumbar paraspinals in prone.    12/31/23 SciFit LE bike 2 min lvl 4  STM to right lunmbar paraspinals  Anterior glide at PSIS grade II  Dead bugs x10 Standing lunge with RLE on table x10 Lumbar stretch on ball 2x10 Scapular squeezes 2x10   12/25/23: Reviewed pt presentation, response to prior treatment, HEP compliance, and pain level.  Reviewed HEP.  Supine march with TrA x10 Supine alt UE/LE x10  Supine clams with TrA with RTB 2x10 Standing rows with RTB 2x10 with TrA Supine alt LE extension with TrA x 10 reps, and with UE's holding ball x 10  reps  Manual Therapy:  STM to R sided lumbar paraspinals in prone.   PT updated HEP and gave pt a HEP handout.  PT educated pt in correct form and appropriate frequency.     PATIENT EDUCATION:  Education details:  exercise form, HEP, POC, scope of PT, relevant anatomy/biomechanics, and lumbar pathology.  PT answered pt's questions including rationale of using ice vs heat. Person educated: Patient Education method: Explanation, Demonstration, and Handouts Education comprehension: verbalized understanding, returned demonstration, verbal cues required, and tactile cues required  HOME EXERCISE PROGRAM: Access Code: [email or web address] URL: [email or web address] Date: 12/12/2023 Prepared by: Prentice Zaunegger  Exercises - Supine Bridge  - 1 x daily - 7 x weekly - 2 sets - 10 reps - Supine Lower Trunk Rotation  - 2 x daily - 7 x weekly - 2 sets - 10 reps - Supine Transversus Abdominis Bracing - Hands on  Stomach  - 2 x daily - 7 x weekly - 2 sets - 10 reps - 5 seconds hold - Supine March  - 1 x daily - 7 x weekly - 2 sets - 10 reps - Supine Core Control with Leg Extension  - 1 x daily - 7 x weekly - 2 sets - 10 reps - Hooklying Clamshell with Resistance  - 1 x daily - 5 x weekly - 2 sets - 10 reps - Standing Shoulder Row with Anchored Resistance  - 1 x daily - 4-5 x weekly - 2 sets - 10 reps  Updated HEP: - Modified Thomas Stretch  - 1 x daily - 7 x weekly - 2-3 reps - 20 seconds hold  ASSESSMENT:  CLINICAL IMPRESSION: PT instructed pt in core strengthening exercises and gently progressed exercises with increasing resistance with supine clams and adding supine SLR.  Pt performed exercises well with cuing and instruction in correct form.  Pt states her hip flexors are tight and feels that she needs to stretch them.  PT had pt perform supine modified thomas stretch and she stated that stretch felt good.  PT updated HEP and gave pt a HEP handout.  PT performed STM to R sided lumbar to  improve pain and soft tissue tightness and mobility and to reduce any myofascial restrictions.  Pt responded well to treatment stating she felt better after treatment than before treatment.  She reports improved pain from 5/10 before treatment to 2/10 after treatment.  OBJECTIVE IMPAIRMENTS: decreased activity tolerance, decreased balance, decreased endurance, decreased mobility, difficulty walking, decreased ROM, decreased strength, hypomobility, increased muscle spasms, impaired flexibility, improper body mechanics, postural dysfunction, and pain  ACTIVITY LIMITATIONS: carrying, lifting, bending, standing, squatting, stairs, transfers, bed mobility, reach over head, locomotion level, and caring for others  PARTICIPATION LIMITATIONS:  meal prep, cleaning, laundry, shopping, community activity, and yard work  PERSONAL FACTORS: Profession and Time since onset of injury/illness/exacerbation are also affecting patient's functional outcome.   REHAB POTENTIAL: Good  CLINICAL DECISION MAKING: Evolving/moderate complexity  EVALUATION COMPLEXITY: Moderate   GOALS: Goals reviewed with patient? Yes  SHORT TERM GOALS: Target date: 01/09/2024    Patient will be independent with HEP in order to improve functional outcomes. Baseline:  Goal status: INITIAL  2.  Patient will report at least 25% improvement in symptoms for improved quality of life. Baseline:  Goal status: INITIAL    LONG TERM GOALS: Target date: 02/06/2024    Patient will report at least 75% improvement in symptoms for improved quality of life. Baseline:  Goal status: INITIAL  2.  Patient will improve Modified Oswestry score by at least 10 points in order to indicate improved tolerance to activity. Baseline: 12/50 Goal status: INITIAL  3.  Patient will demonstrate full  lumbar ROM in all restricted planes for improved ability to move trunk while completing chores/work duties. Baseline:  Goal status: INITIAL  4.   Patient will be able to return to all activities unrestricted for improved ability to perform work functions and  exercise.  Baseline:  Goal status: INITIAL  5.  Patient will demonstrate grade of 5/5 MMT grade in all tested musculature as evidence of improved strength to assist with stair ambulation and gait.   Baseline:  Goal status: INITIAL   PLAN:  PT FREQUENCY: 1-2x/week  PT DURATION: 8 weeks  PLANNED INTERVENTIONS: 97164- PT Re-evaluation, 97110-Therapeutic exercises, 97530- Therapeutic activity, [id number]- Neuromuscular re-education, 97535- Self Care, [id number]- Manual therapy, [id number]- Gait training, [id number]- Orthotic Fit/training,  04007- Canalith repositioning, 02886- Aquatic Therapy, [id number]- Splinting, 02402- Wound care (first 20 sq cm), 97598- Wound care (each additional 20 sq cm)Patient/Family education, Balance training, Stair training, Taping, Dry Needling, Joint mobilization, Joint manipulation, Spinal manipulation, Spinal mobilization, Scar mobilization, and DME instructions.  PLAN FOR NEXT SESSION: f/u with HEP, hip and core strength, postural strength, spinal mobility, manual for pain/mobility   Leigh Minerva III PT, DPT 01/03/24 10:16 AM

## 2024-01-08 ENCOUNTER — Ambulatory Visit (HOSPITAL_BASED_OUTPATIENT_CLINIC_OR_DEPARTMENT_OTHER): Payer: Worker's Compensation | Admitting: Physical Therapy

## 2024-01-08 DIAGNOSIS — R29898 Other symptoms and signs involving the musculoskeletal system: Secondary | ICD-10-CM

## 2024-01-08 DIAGNOSIS — M6281 Muscle weakness (generalized): Secondary | ICD-10-CM

## 2024-01-08 DIAGNOSIS — M5459 Other low back pain: Secondary | ICD-10-CM | POA: Diagnosis not present

## 2024-01-08 NOTE — Therapy (Signed)
 OUTPATIENT PHYSICAL THERAPY THORACOLUMBAR TREATMENT   Patient Name: Nicole Davila MRN: [id number] DOB:Feb 26, 1961, 63 y.o., female Today's Date: 01/09/2024  END OF SESSION:  PT End of Session - 01/08/24 1120     Visit Number 6    Number of Visits 16    Date for Recertification  02/06/24    Authorization Type Worker's Comp    PT Start Time 1115    PT Stop Time 1155    PT Time Calculation (min) 40 min    Activity Tolerance Patient tolerated treatment well    Behavior During Therapy WFL for tasks assessed/performed            History reviewed. No pertinent past medical history. History reviewed. No pertinent surgical history. Patient Active Problem List   Diagnosis Date Noted   Pain in joint, ankle and foot 07/05/2016   Metatarsalgia of right foot 07/05/2016   Abnormality of gait 07/05/2016    PCP: Aisha Harvey, MD   REFERRING PROVIDER: Candance Jeoffrey SAILOR, PA-C  REFERRING DIAG: M54.50 (ICD-10-CM) - Low back pain, unspecified  Rationale for Evaluation and Treatment: Rehabilitation  THERAPY DIAG:  Other low back pain  Muscle weakness (generalized)  Other symptoms and signs involving the musculoskeletal system  ONSET DATE: June 2025  SUBJECTIVE:                                                                                                                                                                                           SUBJECTIVE STATEMENT: Pt states she felt fine after prior treatment.  Pt could feel her back this AM when she woke up.  She walked the track and did 1 set of her exercises and reports feeling better.  Pt states she has been sitting more at work and feels her back pain more with sitting.  She states she has a bad chair.  Pt reports compliance with HEP.    Pt is a member at [employer].  PERTINENT HISTORY:  CAD, GERD Restriction of lifting no > 10 lbs per PA note  PAIN:  Are you having pain? Yes: NPRS scale: 4/10 Pain location: central and  bilat sides of lumbar Pain description: sharp Aggravating factors: lifting, standing, sitting Relieving factors: heat  PRECAUTIONS: None  WEIGHT BEARING RESTRICTIONS: No  FALLS:  Has patient fallen in last 6 months? No  OCCUPATION: librarian  PLOF: Independent  PATIENT GOALS: Wants to know what she can do, Back to feel better   OBJECTIVE: (objective measures from initial evaluation unless otherwise dated)  PATIENT SURVEYS:  Modified Oswestry:  MODIFIED OSWESTRY DISABILITY SCALE  Date: 12/12/23 Score  Pain intensity 1 =  The pain is bad, but I can manage without having to take (1) I can stand as long as I want but, it increases my pain. pain medication.  2. Personal care (washing, dressing, etc.) 0 =  I can take care of myself normally without causing increased pain.  3. Lifting 4 = I can lift only very light weights  4. Walking 1 = Pain prevents me from walking more than 1 mile.  5. Sitting 3 =  Pain prevents me from sitting more than  hour.  6. Standing 1 =  I can stand as long as I want but, it increases my pain.  7. Sleeping 0 = Pain does not prevent me from sleeping well.  8. Social Life 0 = My social life is normal and does not increase my pain.  9. Traveling 1 =  I can travel anywhere, but it increases my pain.  10. Employment/ Homemaking 1 = My normal homemaking/job activities increase my pain, but I can still perform all that is required of me  Total 12/50   Interpretation of scores: Score Category Description  0-20% Minimal Disability The patient can cope with most living activities. Usually no treatment is indicated apart from advice on lifting, sitting and exercise  21-40% Moderate Disability The patient experiences more pain and difficulty with sitting, lifting and standing. Travel and social life are more difficult and they may be disabled from work. Personal care, sexual activity and sleeping are not grossly affected, and the patient can usually be managed by  conservative means  41-60% Severe Disability Pain remains the main problem in this group, but activities of daily living are affected. These patients require a detailed investigation  61-80% Crippled Back pain impinges on all aspects of the patient's life. Positive intervention is required  81-100% Bed-bound  These patients are either bed-bound or exaggerating their symptoms  Bluford FORBES [name], et al. Surgery versus conservative management of stable thoracolumbar fracture: the PRESTO feasibility RCT. [place] ([place]): [employer]; 2021 Nov. [facility] Technology Assessment, No. 25.62.) Appendix 3, Oswestry Disability Index category descriptors. Available from: [employer]cz  Minimally Clinically Important Difference (MCID) = 12.8%  SCREENING FOR RED FLAGS: Bowel or bladder incontinence: No Spinal tumors: No Cauda equina syndrome: No Compression fracture: No Abdominal aneurysm: No  COGNITION: Overall cognitive status: Within functional limits for tasks assessed     SENSATION: WFL    POSTURE: decreased lumbar lordosis  PALPATION: Hypomobile grossly in thoracic and lumbar spine, tenderness mid thoracic and upper lumbar UPA; TTP bilateral but R>L paraspinals and QL, R glute tenderness; notes increased tenderness at R L5 paraspinals following testing  LUMBAR ROM: stretch with rotation bilateral  AROM eval  Flexion 0% limited  Extension 0% limited  Right lateral flexion 0% limited  Left lateral flexion 25% limited*  Right rotation 0% limited  Left rotation 0% limited   (Blank rows = not tested) * = pain/symptoms  LOWER EXTREMITY ROM:   WFL for tasks assessed  Active  Right eval Left eval  Hip flexion    Hip extension    Hip abduction    Hip adduction    Hip internal rotation    Hip external rotation    Knee flexion    Knee extension    Ankle dorsiflexion    Ankle plantarflexion    Ankle inversion    Ankle eversion      (Blank rows = not tested) * = pain/symptoms  LOWER EXTREMITY MMT:  MMT Right eval Left eval  Hip flexion 4+ 4+  Hip extension 4- 4-  Hip abduction 4- * 4-  Hip adduction    Hip internal rotation    Hip external rotation    Knee flexion 5 5  Knee extension 5 5  Ankle dorsiflexion 5 5  Ankle plantarflexion    Ankle inversion    Ankle eversion     (Blank rows = not tested) * = pain/symptoms    FUNCTIONAL TESTS:  5 times sit to stand: 9.57 seconds without UE support, mild L >R  Squat/lifting mechanics: fair mechanics  GAIT: Distance walked: 100 feet Assistive device utilized: None Level of assistance: Complete Independence Comments: Mild R compensated trendelenberg  TODAY'S TREATMENT:                                                                                                                              DATE:  01/08/24 Reviewed pt presentation, response to prior treatment, HEP compliance, and pain level.   Supine alt UE/LE 2x10 Supine alt LE extension with TrA with UE's holding 2.2# ball 2x10 reps Supine clams with TrA with GTB 2x10  Supine SLR with TrA 2x10 each Standing rows with GTB 2x10 with TrA Standing shoulder extension with RTB with TrA 2x10 Supine modified thomas stretch 3x20 sec bilat  Manual Therapy:  STM and TPR to bilat lumbar paraspinals and to QL in prone.    01/03/24 Reviewed pt presentation, response to prior treatment, HEP compliance, and pain level.   Supine alt UE/LE 2x10 Supine alt LE extension with TrA 2x10 reps, and with UE's holding 1.1# ball x 10 reps Supine clams with TrA with RTB x 15, with GTB x10  Supine SLR with TrA x10 each Supine modified thomas stretch 3x20 sec bilat Standing rows with RTB x10 with TrA  Updated HEP.  Pt received a HEP handout and was educated in correct form and appropriate frequency.  PT instructed pt she should not have pain with stretching.  Manual Therapy:  STM to R sided lumbar paraspinals in prone.     12/31/23 Sci[employer] LE bike 2 min lvl 4  STM to right lunmbar paraspinals  Anterior glide at PSIS grade II  Dead bugs x10 Standing lunge with RLE on table x10 Lumbar stretch on ball 2x10 Scapular squeezes 2x10    PATIENT EDUCATION:  Education details:  exercise form, HEP, POC, scope of PT, relevant anatomy, and rationale of interventions.  PT answered pt's questions. Person educated: Patient Education method: Explanation, Demonstration, verbal cues Education comprehension: verbalized understanding, returned demonstration, verbal cues required   HOME EXERCISE PROGRAM: Access Code: [email or web address] URL: [email or web address] Date: 12/12/2023 Prepared by: Prentice Zaunegger  Exercises - Supine Bridge  - 1 x daily - 7 x weekly - 2 sets - 10 reps - Supine Lower Trunk Rotation  - 2 x daily - 7 x weekly - 2 sets - 10 reps - Supine Transversus Abdominis Bracing - Hands  on Stomach  - 2 x daily - 7 x weekly - 2 sets - 10 reps - 5 seconds hold - Supine March  - 1 x daily - 7 x weekly - 2 sets - 10 reps - Supine Core Control with Leg Extension  - 1 x daily - 7 x weekly - 2 sets - 10 reps - Hooklying Clamshell with Resistance  - 1 x daily - 5 x weekly - 2 sets - 10 reps - Standing Shoulder Row with Anchored Resistance  - 1 x daily - 4-5 x weekly - 2 sets - 10 reps - Modified Thomas Stretch  - 1 x daily - 7 x weekly - 2-3 reps - 20 seconds hold  ASSESSMENT:  CLINICAL IMPRESSION: Pt states she has been sitting more at work and feels her back pain more with sitting.  PT performed core and postural exercises to improve lumbopelvic and postural strength and stability and to reduce pain.  Pt is progressing with core exercises.  PT performed STM to R sided lumbar to improve pain and soft tissue tightness and mobility and to reduce any myofascial restrictions.  Pt responded well to treatment reporting improved pain after treatment.  She should benefit form cont skilled PT to address goals and  impairments and improve overall function.   OBJECTIVE IMPAIRMENTS: decreased activity tolerance, decreased balance, decreased endurance, decreased mobility, difficulty walking, decreased ROM, decreased strength, hypomobility, increased muscle spasms, impaired flexibility, improper body mechanics, postural dysfunction, and pain  ACTIVITY LIMITATIONS: carrying, lifting, bending, standing, squatting, stairs, transfers, bed mobility, reach over head, locomotion level, and caring for others  PARTICIPATION LIMITATIONS:  meal prep, cleaning, laundry, shopping, community activity, and yard work  PERSONAL FACTORS: Profession and Time since onset of injury/illness/exacerbation are also affecting patient's functional outcome.   REHAB POTENTIAL: Good  CLINICAL DECISION MAKING: Evolving/moderate complexity  EVALUATION COMPLEXITY: Moderate   GOALS: Goals reviewed with patient? Yes  SHORT TERM GOALS: Target date: 01/09/2024    Patient will be independent with HEP in order to improve functional outcomes. Baseline:  Goal status: INITIAL  2.  Patient will report at least 25% improvement in symptoms for improved quality of life. Baseline:  Goal status: INITIAL    LONG TERM GOALS: Target date: 02/06/2024    Patient will report at least 75% improvement in symptoms for improved quality of life. Baseline:  Goal status: INITIAL  2.  Patient will improve Modified Oswestry score by at least 10 points in order to indicate improved tolerance to activity. Baseline: 12/50 Goal status: INITIAL  3.  Patient will demonstrate full  lumbar ROM in all restricted planes for improved ability to move trunk while completing chores/work duties. Baseline:  Goal status: INITIAL  4.  Patient will be able to return to all activities unrestricted for improved ability to perform work functions and  exercise.  Baseline:  Goal status: INITIAL  5.  Patient will demonstrate grade of 5/5 MMT grade in all tested  musculature as evidence of improved strength to assist with stair ambulation and gait.   Baseline:  Goal status: INITIAL   PLAN:  PT FREQUENCY: 1-2x/week  PT DURATION: 8 weeks  PLANNED INTERVENTIONS: 97164- PT Re-evaluation, 97110-Therapeutic exercises, 97530- Therapeutic activity, [id number]- Neuromuscular re-education, 97535- Self Care, [id number]- Manual therapy, [id number]- Gait training, [id number]- Orthotic Fit/training, [phone]- Canalith repositioning, [id number]- Aquatic Therapy, 97760- Splinting, [id number]- Wound care (first 20 sq cm), 02401- Wound care (each additional 20 sq cm)Patient/Family education, Balance training, Stair training, Taping, Dry Needling, Joint mobilization,  Joint manipulation, Spinal manipulation, Spinal mobilization, Scar mobilization, and DME instructions.  PLAN FOR NEXT SESSION: f/u with HEP, hip and core strength, postural strength, spinal mobility, manual for pain/mobility   Leigh Minerva III PT, DPT 01/09/24 9:29 PM

## 2024-01-09 ENCOUNTER — Encounter (HOSPITAL_BASED_OUTPATIENT_CLINIC_OR_DEPARTMENT_OTHER): Payer: Self-pay | Admitting: Physical Therapy

## 2024-01-10 ENCOUNTER — Encounter (HOSPITAL_BASED_OUTPATIENT_CLINIC_OR_DEPARTMENT_OTHER): Payer: Self-pay | Admitting: Physical Therapy

## 2024-01-13 ENCOUNTER — Ambulatory Visit (HOSPITAL_BASED_OUTPATIENT_CLINIC_OR_DEPARTMENT_OTHER): Payer: Worker's Compensation | Admitting: Physical Therapy

## 2024-01-13 ENCOUNTER — Encounter (HOSPITAL_BASED_OUTPATIENT_CLINIC_OR_DEPARTMENT_OTHER): Payer: Self-pay | Admitting: Physical Therapy

## 2024-01-13 DIAGNOSIS — M5459 Other low back pain: Secondary | ICD-10-CM | POA: Diagnosis not present

## 2024-01-13 DIAGNOSIS — M6281 Muscle weakness (generalized): Secondary | ICD-10-CM

## 2024-01-13 DIAGNOSIS — R29898 Other symptoms and signs involving the musculoskeletal system: Secondary | ICD-10-CM

## 2024-01-13 NOTE — Therapy (Signed)
 OUTPATIENT PHYSICAL THERAPY THORACOLUMBAR TREATMENT   Patient Name: Nicole Davila MRN: [id number] DOB:1960-09-14, 63 y.o., female Today's Date: 01/14/2024  END OF SESSION:  PT End of Session - 01/13/24 1419     Visit Number 7    Number of Visits 16    Date for Recertification  02/06/24    Authorization Type Worker's Comp    PT Start Time 1415    PT Stop Time 1457    PT Time Calculation (min) 42 min    Activity Tolerance Patient tolerated treatment well    Behavior During Therapy WFL for tasks assessed/performed             History reviewed. No pertinent past medical history. History reviewed. No pertinent surgical history. Patient Active Problem List   Diagnosis Date Noted   Pain in joint, ankle and foot 07/05/2016   Metatarsalgia of right foot 07/05/2016   Abnormality of gait 07/05/2016    PCP: Aisha Harvey, MD   REFERRING PROVIDER: Candance Jeoffrey SAILOR, PA-C  REFERRING DIAG: M54.50 (ICD-10-CM) - Low back pain, unspecified  Rationale for Evaluation and Treatment: Rehabilitation  THERAPY DIAG:  Other low back pain  Muscle weakness (generalized)  Other symptoms and signs involving the musculoskeletal system  ONSET DATE: June 2025  SUBJECTIVE:                                                                                                                                                                                           SUBJECTIVE STATEMENT: Pt had no adverse effects after prior treatment.  Pt reports she has soreness sometimes after therapy and had a little soreness after prior treatment.  Pt reports PT is helping.  Pt sates she has reduced pain in sitting and at work.  Pt reports she is performing her home exercises though is going to try do them more frequently.  Pt walked on the pavement at [facility] for 1.5 hours and had increased pain the following day.     Pt is a member at [employer].  PERTINENT HISTORY:  CAD, GERD Restriction of lifting no > 10 lbs  per PA note  PAIN:  Are you having pain? Yes: NPRS scale: 2/10 Pain location: R sided lumbar  Pain description: sharp Aggravating factors: lifting, standing, sitting Relieving factors: heat  PRECAUTIONS: None  WEIGHT BEARING RESTRICTIONS: No  FALLS:  Has patient fallen in last 6 months? No  OCCUPATION: librarian  PLOF: Independent  PATIENT GOALS: Wants to know what she can do, Back to feel better   OBJECTIVE: (objective measures from initial evaluation unless otherwise dated)  PATIENT SURVEYS:  Modified Oswestry:  MODIFIED OSWESTRY DISABILITY SCALE  Date: 12/12/23 Score  Pain intensity 1 = The pain is bad, but I can manage without having to take (1) I can stand as long as I want but, it increases my pain. pain medication.  2. Personal care (washing, dressing, etc.) 0 =  I can take care of myself normally without causing increased pain.  3. Lifting 4 = I can lift only very light weights  4. Walking 1 = Pain prevents me from walking more than 1 mile.  5. Sitting 3 =  Pain prevents me from sitting more than  hour.  6. Standing 1 =  I can stand as long as I want but, it increases my pain.  7. Sleeping 0 = Pain does not prevent me from sleeping well.  8. Social Life 0 = My social life is normal and does not increase my pain.  9. Traveling 1 =  I can travel anywhere, but it increases my pain.  10. Employment/ Homemaking 1 = My normal homemaking/job activities increase my pain, but I can still perform all that is required of me  Total 12/50   Interpretation of scores: Score Category Description  0-20% Minimal Disability The patient can cope with most living activities. Usually no treatment is indicated apart from advice on lifting, sitting and exercise  21-40% Moderate Disability The patient experiences more pain and difficulty with sitting, lifting and standing. Travel and social life are more difficult and they may be disabled from work. Personal care, sexual activity and  sleeping are not grossly affected, and the patient can usually be managed by conservative means  41-60% Severe Disability Pain remains the main problem in this group, but activities of daily living are affected. These patients require a detailed investigation  61-80% Crippled Back pain impinges on all aspects of the patient's life. Positive intervention is required  81-100% Bed-bound  These patients are either bed-bound or exaggerating their symptoms  Bluford FORBES [name], et al. Surgery versus conservative management of stable thoracolumbar fracture: the PRESTO feasibility RCT. [place] ([place]): [employer]; 2021 Nov. [facility] Technology Assessment, No. 25.62.) Appendix 3, Oswestry Disability Index category descriptors. Available from: FindJewel[employer].cz  Minimally Clinically Important Difference (MCID) = 12.8%  SCREENING FOR RED FLAGS: Bowel or bladder incontinence: No Spinal tumors: No Cauda equina syndrome: No Compression fracture: No Abdominal aneurysm: No  COGNITION: Overall cognitive status: Within functional limits for tasks assessed     SENSATION: WFL    POSTURE: decreased lumbar lordosis  PALPATION: Hypomobile grossly in thoracic and lumbar spine, tenderness mid thoracic and upper lumbar UPA; TTP bilateral but R>L paraspinals and QL, R glute tenderness; notes increased tenderness at R L5 paraspinals following testing  LUMBAR ROM: stretch with rotation bilateral  AROM eval  Flexion 0% limited  Extension 0% limited  Right lateral flexion 0% limited  Left lateral flexion 25% limited*  Right rotation 0% limited  Left rotation 0% limited   (Blank rows = not tested) * = pain/symptoms  LOWER EXTREMITY ROM:   WFL for tasks assessed  Active  Right eval Left eval  Hip flexion    Hip extension    Hip abduction    Hip adduction    Hip internal rotation    Hip external rotation    Knee flexion    Knee extension    Ankle  dorsiflexion    Ankle plantarflexion    Ankle inversion    Ankle eversion     (Blank rows =  not tested) * = pain/symptoms  LOWER EXTREMITY MMT:    MMT Right eval Left eval  Hip flexion 4+ 4+  Hip extension 4- 4-  Hip abduction 4- * 4-  Hip adduction    Hip internal rotation    Hip external rotation    Knee flexion 5 5  Knee extension 5 5  Ankle dorsiflexion 5 5  Ankle plantarflexion    Ankle inversion    Ankle eversion     (Blank rows = not tested) * = pain/symptoms    FUNCTIONAL TESTS:  5 times sit to stand: 9.57 seconds without UE support, mild L >R  Squat/lifting mechanics: fair mechanics  GAIT: Distance walked: 100 feet Assistive device utilized: None Level of assistance: Complete Independence Comments: Mild R compensated trendelenberg  TODAY'S TREATMENT:                                                                                                                              DATE:  01/13/24 Reviewed pt presentation, response to prior treatment, HEP compliance, and pain level.    Manual Therapy:   STM and TPR to R sided lumbar paraspinals, R QL, and R glute in prone PT educated pt and demonstrated using a theracane.  Pt used a theracane with PT instruction  Supine alt UE/LE x12 with TrA Supine SLR with TrA x 10 Supine SLR with contralateral UE extension with TrAx10 Supine bridge x 10, 2x10 with clams with RTB with TrA Standing shoulder extension with RTB with TrA 2x10 Paloff press with TrA with RTB x10 each   01/08/24 Reviewed pt presentation, response to prior treatment, HEP compliance, and pain level.   Supine alt UE/LE 2x10 Supine alt LE extension with TrA with UE's holding 2.2# ball 2x10 reps Supine clams with TrA with GTB 2x10  Supine SLR with TrA 2x10 each Standing rows with GTB 2x10 with TrA Standing shoulder extension with RTB with TrA 2x10 Supine modified thomas stretch 3x20 sec bilat  Manual Therapy:  STM and TPR to bilat lumbar  paraspinals and to QL in prone.    01/03/24 Reviewed pt presentation, response to prior treatment, HEP compliance, and pain level.   Supine alt UE/LE 2x10 Supine alt LE extension with TrA 2x10 reps, and with UE's holding 1.1# ball x 10 reps Supine clams with TrA with RTB x 15, with GTB x10  Supine SLR with TrA x10 each Supine modified thomas stretch 3x20 sec bilat Standing rows with RTB x10 with TrA  Updated HEP.  Pt received a HEP handout and was educated in correct form and appropriate frequency.  PT instructed pt she should not have pain with stretching.  Manual Therapy:  STM to R sided lumbar paraspinals in prone.    12/31/23 SciFit LE bike 2 min lvl 4  STM to right lunmbar paraspinals  Anterior glide at PSIS grade II  Dead bugs x10 Standing lunge with RLE on table x10 Lumbar stretch on  ball 2x10 Scapular squeezes 2x10    PATIENT EDUCATION:  Education details:  exercise form, HEP, POC, scope of PT, relevant anatomy, and rationale of interventions.  PT answered pt's questions. Person educated: Patient Education method: Explanation, Demonstration, verbal cues Education comprehension: verbalized understanding, returned demonstration, verbal cues required   HOME EXERCISE PROGRAM: Access Code: [email or web address] URL: [email or web address] Date: 12/12/2023 Prepared by: Prentice Zaunegger  Exercises - Supine Bridge  - 1 x daily - 7 x weekly - 2 sets - 10 reps - Supine Lower Trunk Rotation  - 2 x daily - 7 x weekly - 2 sets - 10 reps - Supine Transversus Abdominis Bracing - Hands on Stomach  - 2 x daily - 7 x weekly - 2 sets - 10 reps - 5 seconds hold - Supine March  - 1 x daily - 7 x weekly - 2 sets - 10 reps - Supine Core Control with Leg Extension  - 1 x daily - 7 x weekly - 2 sets - 10 reps - Hooklying Clamshell with Resistance  - 1 x daily - 5 x weekly - 2 sets - 10 reps - Standing Shoulder Row with Anchored Resistance  - 1 x daily - 4-5 x weekly - 2 sets - 10 reps -  Modified Thomas Stretch  - 1 x daily - 7 x weekly - 2-3 reps - 20 seconds hold  ASSESSMENT:  CLINICAL IMPRESSION: Pt reports that PT is helping.  She has reduced pain with sitting at work.  Pt is compliant with HEP and has been performing walking program.  Pt performed exercises well and had good tolerance.   Pt does have soft tissue tightness in lumbar paraspinals.  PT instructed pt in using a tennis ball on the wall to improve soft tissue tightness and she has been using a ball at home.  PT also demonstrated and educated pt in using a theracane.  She responded well to treatment stating she feels good after treatment having no increaed pain just a little soreness.   OBJECTIVE IMPAIRMENTS: decreased activity tolerance, decreased balance, decreased endurance, decreased mobility, difficulty walking, decreased ROM, decreased strength, hypomobility, increased muscle spasms, impaired flexibility, improper body mechanics, postural dysfunction, and pain  ACTIVITY LIMITATIONS: carrying, lifting, bending, standing, squatting, stairs, transfers, bed mobility, reach over head, locomotion level, and caring for others  PARTICIPATION LIMITATIONS:  meal prep, cleaning, laundry, shopping, community activity, and yard work  PERSONAL FACTORS: Profession and Time since onset of injury/illness/exacerbation are also affecting patient's functional outcome.   REHAB POTENTIAL: Good  CLINICAL DECISION MAKING: Evolving/moderate complexity  EVALUATION COMPLEXITY: Moderate   GOALS: Goals reviewed with patient? Yes  SHORT TERM GOALS: Target date: 01/09/2024    Patient will be independent with HEP in order to improve functional outcomes. Baseline:  Goal status: INITIAL  2.  Patient will report at least 25% improvement in symptoms for improved quality of life. Baseline:  Goal status: INITIAL    LONG TERM GOALS: Target date: 02/06/2024    Patient will report at least 75% improvement in symptoms for improved  quality of life. Baseline:  Goal status: INITIAL  2.  Patient will improve Modified Oswestry score by at least 10 points in order to indicate improved tolerance to activity. Baseline: 12/50 Goal status: INITIAL  3.  Patient will demonstrate full  lumbar ROM in all restricted planes for improved ability to move trunk while completing chores/work duties. Baseline:  Goal status: INITIAL  4.  Patient will be able to return to all  activities unrestricted for improved ability to perform work functions and  exercise.  Baseline:  Goal status: INITIAL  5.  Patient will demonstrate grade of 5/5 MMT grade in all tested musculature as evidence of improved strength to assist with stair ambulation and gait.   Baseline:  Goal status: INITIAL   PLAN:  PT FREQUENCY: 1-2x/week  PT DURATION: 8 weeks  PLANNED INTERVENTIONS: [id number]- PT Re-evaluation, 97110-Therapeutic exercises, 97530- Therapeutic activity, [id number]- Neuromuscular re-education, 97535- Self Care, [id number]- Manual therapy, [id number]- Gait training, [phone]- Orthotic Fit/training, [phone]- Canalith repositioning, [id number]- Aquatic Therapy, [phone]- Splinting, [phone]- Wound care (first 20 sq cm), 97598- Wound care (each additional 20 sq cm)Patient/Family education, Balance training, Stair training, Taping, Dry Needling, Joint mobilization, Joint manipulation, Spinal manipulation, Spinal mobilization, Scar mobilization, and DME instructions.  PLAN FOR NEXT SESSION: f/u with HEP, hip and core strength, postural strength, spinal mobility, manual for pain/mobility   Leigh Minerva III PT, DPT 01/14/24 8:25 PM

## 2024-01-14 ENCOUNTER — Ambulatory Visit
Admission: RE | Admit: 2024-01-14 | Discharge: 2024-01-14 | Disposition: A | Discharge status: Home / Self Care | Source: Ambulatory Visit | Attending: Family Medicine | Admitting: Family Medicine

## 2024-01-14 DIAGNOSIS — R4789 Other speech disturbances: Secondary | ICD-10-CM

## 2024-01-14 MED ORDER — GADOPICLENOL 0.5 MMOL/ML IV SOLN
6.5000 mL | Freq: Once | INTRAVENOUS | Status: AC | PRN
  Administered 2024-01-14: 6.5 mL via INTRAVENOUS

## 2024-01-16 ENCOUNTER — Ambulatory Visit (HOSPITAL_BASED_OUTPATIENT_CLINIC_OR_DEPARTMENT_OTHER): Payer: Worker's Compensation | Admitting: Physical Therapy

## 2024-01-21 ENCOUNTER — Encounter (HOSPITAL_BASED_OUTPATIENT_CLINIC_OR_DEPARTMENT_OTHER): Admitting: Physical Therapy

## 2024-01-23 ENCOUNTER — Ambulatory Visit (HOSPITAL_BASED_OUTPATIENT_CLINIC_OR_DEPARTMENT_OTHER): Payer: Worker's Compensation | Admitting: Physical Therapy

## 2024-01-28 ENCOUNTER — Ambulatory Visit (HOSPITAL_BASED_OUTPATIENT_CLINIC_OR_DEPARTMENT_OTHER): Payer: Worker's Compensation | Attending: Physician Assistant | Admitting: Physical Therapy

## 2024-01-28 DIAGNOSIS — R2689 Other abnormalities of gait and mobility: Secondary | ICD-10-CM | POA: Insufficient documentation

## 2024-01-28 DIAGNOSIS — R29898 Other symptoms and signs involving the musculoskeletal system: Secondary | ICD-10-CM | POA: Insufficient documentation

## 2024-01-28 DIAGNOSIS — M545 Low back pain, unspecified: Secondary | ICD-10-CM | POA: Diagnosis present

## 2024-01-28 DIAGNOSIS — M6281 Muscle weakness (generalized): Secondary | ICD-10-CM | POA: Diagnosis not present

## 2024-01-28 DIAGNOSIS — M5459 Other low back pain: Secondary | ICD-10-CM | POA: Diagnosis not present

## 2024-01-28 NOTE — Therapy (Unsigned)
 OUTPATIENT PHYSICAL THERAPY THORACOLUMBAR TREATMENT   Patient Name: Nicole Davila MRN: [id number] DOB:09-26-1960, 63 y.o., female Today's Date: 01/29/2024  END OF SESSION:  PT End of Session - 01/28/24 1548     Visit Number 8    Number of Visits 16    Date for Recertification  02/06/24    Authorization Type Worker's Comp    PT Start Time 1542    PT Stop Time 1622    PT Time Calculation (min) 40 min    Activity Tolerance Patient tolerated treatment well    Behavior During Therapy WFL for tasks assessed/performed              History reviewed. No pertinent past medical history. History reviewed. No pertinent surgical history. Patient Active Problem List   Diagnosis Date Noted   Pain in joint, ankle and foot 07/05/2016   Metatarsalgia of right foot 07/05/2016   Abnormality of gait 07/05/2016    PCP: Aisha Harvey, MD   REFERRING PROVIDER: Candance Jeoffrey SAILOR, PA-C  REFERRING DIAG: M54.50 (ICD-10-CM) - Low back pain, unspecified  Rationale for Evaluation and Treatment: Rehabilitation  THERAPY DIAG:  Other low back pain  Muscle weakness (generalized)  Other symptoms and signs involving the musculoskeletal system  ONSET DATE: June 2025  SUBJECTIVE:                                                                                                                                                                                           SUBJECTIVE STATEMENT: Pt reports having some speech problems over the past couple of months.  Pt had a recent MRI which was normal, it showed no CVA.  Pt has been sick since prior visit.  She's feeling better now.  Pt states her back was great while she was sick.  She hasn't been performing her HEP or walking program due to being sick.  Pt states she had increased pain when she was at the ortho MD showing specific movements that cause pain.  Pt has pain with twisting movements including when sitting in the car.  Pt had no adverse effects after  prior treatment.    Pt is a member at [employer].  PERTINENT HISTORY:  CAD, GERD Restriction of lifting no > 10 lbs per PA note  PAIN:  Are you having pain? Yes: NPRS scale: 2/10 Pain location: R sided lumbar  Pain description: sharp Aggravating factors: lifting, standing, sitting Relieving factors: heat  PRECAUTIONS: None  WEIGHT BEARING RESTRICTIONS: No  FALLS:  Has patient fallen in last 6 months? No  OCCUPATION: librarian  PLOF: Independent  PATIENT GOALS: Wants to know what  she can do, Back to feel better   OBJECTIVE: (objective measures from initial evaluation unless otherwise dated)  PATIENT SURVEYS:  Modified Oswestry:  MODIFIED OSWESTRY DISABILITY SCALE  Date: 12/12/23 Score  Pain intensity 1 = The pain is bad, but I can manage without having to take (1) I can stand as long as I want but, it increases my pain. pain medication.  2. Personal care (washing, dressing, etc.) 0 =  I can take care of myself normally without causing increased pain.  3. Lifting 4 = I can lift only very light weights  4. Walking 1 = Pain prevents me from walking more than 1 mile.  5. Sitting 3 =  Pain prevents me from sitting more than  hour.  6. Standing 1 =  I can stand as long as I want but, it increases my pain.  7. Sleeping 0 = Pain does not prevent me from sleeping well.  8. Social Life 0 = My social life is normal and does not increase my pain.  9. Traveling 1 =  I can travel anywhere, but it increases my pain.  10. Employment/ Homemaking 1 = My normal homemaking/job activities increase my pain, but I can still perform all that is required of me  Total 12/50   Interpretation of scores: Score Category Description  0-20% Minimal Disability The patient can cope with most living activities. Usually no treatment is indicated apart from advice on lifting, sitting and exercise  21-40% Moderate Disability The patient experiences more pain and difficulty with sitting, lifting and  standing. Travel and social life are more difficult and they may be disabled from work. Personal care, sexual activity and sleeping are not grossly affected, and the patient can usually be managed by conservative means  41-60% Severe Disability Pain remains the main problem in this group, but activities of daily living are affected. These patients require a detailed investigation  61-80% Crippled Back pain impinges on all aspects of the patient's life. Positive intervention is required  81-100% Bed-bound  These patients are either bed-bound or exaggerating their symptoms  Bluford FORBES [name], et al. Surgery versus conservative management of stable thoracolumbar fracture: the PRESTO feasibility RCT. [place] ([place]): [employer]; 2021 Nov. [facility] Technology Assessment, No. 25.62.) Appendix 3, Oswestry Disability Index category descriptors. Available from: Findjewelers.cz  Minimally Clinically Important Difference (MCID) = 12.8%  SCREENING FOR RED FLAGS: Bowel or bladder incontinence: No Spinal tumors: No Cauda equina syndrome: No Compression fracture: No Abdominal aneurysm: No  COGNITION: Overall cognitive status: Within functional limits for tasks assessed     SENSATION: WFL    POSTURE: decreased lumbar lordosis  PALPATION: Hypomobile grossly in thoracic and lumbar spine, tenderness mid thoracic and upper lumbar UPA; TTP bilateral but R>L paraspinals and QL, R glute tenderness; notes increased tenderness at R L5 paraspinals following testing  LUMBAR ROM: stretch with rotation bilateral  AROM eval  Flexion 0% limited  Extension 0% limited  Right lateral flexion 0% limited  Left lateral flexion 25% limited*  Right rotation 0% limited  Left rotation 0% limited   (Blank rows = not tested) * = pain/symptoms  LOWER EXTREMITY ROM:   WFL for tasks assessed  Active  Right eval Left eval  Hip flexion    Hip extension    Hip  abduction    Hip adduction    Hip internal rotation    Hip external rotation    Knee flexion    Knee extension  Ankle dorsiflexion    Ankle plantarflexion    Ankle inversion    Ankle eversion     (Blank rows = not tested) * = pain/symptoms  LOWER EXTREMITY MMT:    MMT Right eval Left eval  Hip flexion 4+ 4+  Hip extension 4- 4-  Hip abduction 4- * 4-  Hip adduction    Hip internal rotation    Hip external rotation    Knee flexion 5 5  Knee extension 5 5  Ankle dorsiflexion 5 5  Ankle plantarflexion    Ankle inversion    Ankle eversion     (Blank rows = not tested) * = pain/symptoms    FUNCTIONAL TESTS:  5 times sit to stand: 9.57 seconds without UE support, mild L >R  Squat/lifting mechanics: fair mechanics  GAIT: Distance walked: 100 feet Assistive device utilized: None Level of assistance: Complete Independence Comments: Mild R compensated trendelenberg  TODAY'S TREATMENT:                                                                                                                              DATE:  01/28/24 Reviewed pt presentation, HEP compliance, and pain level.  Supine alt UE/LE with TrA x12  Supine SLR with TrA x10 each Supine SLR with contralateral UE extenion with TRA x10 each Standing paloff press with TrA with RTB x 10 each Supine bridge with clams with RTB with TrA  x10 Standing alternating shoulder extension with TrA with RTB 2x10  Manual Therapy:   STM and TPR to R sided lumbar paraspinals, and R QL in prone   01/13/24 Reviewed pt presentation, response to prior treatment, HEP compliance, and pain level.    Manual Therapy:   STM and TPR to R sided lumbar paraspinals, R QL, and R glute in prone PT educated pt and demonstrated using a theracane.  Pt used a theracane with PT instruction  Supine alt UE/LE x12 with TrA Supine SLR with TrA x 10 Supine SLR with contralateral UE extension with TrAx10 Supine bridge x 10, 2x10 with clams  with RTB with TrA Standing shoulder extension with RTB with TrA 2x10 Paloff press with TrA with RTB x10 each   01/08/24 Reviewed pt presentation, response to prior treatment, HEP compliance, and pain level.   Supine alt UE/LE 2x10 Supine alt LE extension with TrA with UE's holding 2.2# ball 2x10 reps Supine clams with TrA with GTB 2x10  Supine SLR with TrA 2x10 each Standing rows with GTB 2x10 with TrA Standing shoulder extension with RTB with TrA 2x10 Supine modified thomas stretch 3x20 sec bilat  Manual Therapy:  STM and TPR to bilat lumbar paraspinals and to QL in prone.    01/03/24 Reviewed pt presentation, response to prior treatment, HEP compliance, and pain level.   Supine alt UE/LE 2x10 Supine alt LE extension with TrA 2x10 reps, and with UE's holding 1.1# ball x 10 reps Supine clams with TrA with  RTB x 15, with GTB x10  Supine SLR with TrA x10 each Supine modified thomas stretch 3x20 sec bilat Standing rows with RTB x10 with TrA  Updated HEP.  Pt received a HEP handout and was educated in correct form and appropriate frequency.  PT instructed pt she should not have pain with stretching.  Manual Therapy:  STM to R sided lumbar paraspinals in prone.    PATIENT EDUCATION:  Education details:  exercise form, HEP, POC, scope of PT, relevant anatomy, and rationale of interventions.  PT answered pt's questions. Person educated: Patient Education method: Explanation, Demonstration, verbal cues Education comprehension: verbalized understanding, returned demonstration, verbal cues required   HOME EXERCISE PROGRAM: Access Code: [email or web address] URL: [email or web address] Date: 12/12/2023 Prepared by: Prentice Zaunegger  Exercises - Supine Bridge  - 1 x daily - 7 x weekly - 2 sets - 10 reps - Supine Lower Trunk Rotation  - 2 x daily - 7 x weekly - 2 sets - 10 reps - Supine Transversus Abdominis Bracing - Hands on Stomach  - 2 x daily - 7 x weekly - 2 sets - 10 reps -  5 seconds hold - Supine March  - 1 x daily - 7 x weekly - 2 sets - 10 reps - Supine Core Control with Leg Extension  - 1 x daily - 7 x weekly - 2 sets - 10 reps - Hooklying Clamshell with Resistance  - 1 x daily - 5 x weekly - 2 sets - 10 reps - Standing Shoulder Row with Anchored Resistance  - 1 x daily - 4-5 x weekly - 2 sets - 10 reps - Modified Thomas Stretch  - 1 x daily - 7 x weekly - 2-3 reps - 20 seconds hold  ASSESSMENT:  CLINICAL IMPRESSION: Pt returns to PT after being sick.  Pt reports her pain has improved overall.  She performed core exercises well and had good tolerance with exercises.  She could feel it on her R side with supine SLR with TrA which improved with performing contralateral UE ext with SLR.  PT performed STM to lumbar paraspinals and QL.  She responded well to treatment stating she felt better after treatment.      OBJECTIVE IMPAIRMENTS: decreased activity tolerance, decreased balance, decreased endurance, decreased mobility, difficulty walking, decreased ROM, decreased strength, hypomobility, increased muscle spasms, impaired flexibility, improper body mechanics, postural dysfunction, and pain  ACTIVITY LIMITATIONS: carrying, lifting, bending, standing, squatting, stairs, transfers, bed mobility, reach over head, locomotion level, and caring for others  PARTICIPATION LIMITATIONS:  meal prep, cleaning, laundry, shopping, community activity, and yard work  PERSONAL FACTORS: Profession and Time since onset of injury/illness/exacerbation are also affecting patient's functional outcome.   REHAB POTENTIAL: Good  CLINICAL DECISION MAKING: Evolving/moderate complexity  EVALUATION COMPLEXITY: Moderate   GOALS: Goals reviewed with patient? Yes  SHORT TERM GOALS: Target date: 01/09/2024    Patient will be independent with HEP in order to improve functional outcomes. Baseline:  Goal status: INITIAL  2.  Patient will report at least 25% improvement in symptoms  for improved quality of life. Baseline:  Goal status: INITIAL    LONG TERM GOALS: Target date: 02/06/2024    Patient will report at least 75% improvement in symptoms for improved quality of life. Baseline:  Goal status: INITIAL  2.  Patient will improve Modified Oswestry score by at least 10 points in order to indicate improved tolerance to activity. Baseline: 12/50 Goal status: INITIAL  3.  Patient will demonstrate full  lumbar ROM in all restricted planes for improved ability to move trunk while completing chores/work duties. Baseline:  Goal status: INITIAL  4.  Patient will be able to return to all activities unrestricted for improved ability to perform work functions and  exercise.  Baseline:  Goal status: INITIAL  5.  Patient will demonstrate grade of 5/5 MMT grade in all tested musculature as evidence of improved strength to assist with stair ambulation and gait.   Baseline:  Goal status: INITIAL   PLAN:  PT FREQUENCY: 1-2x/week  PT DURATION: 8 weeks  PLANNED INTERVENTIONS: [id number]- PT Re-evaluation, 97110-Therapeutic exercises, 97530- Therapeutic activity, [id number]- Neuromuscular re-education, 97535- Self Care, [id number]- Manual therapy, [id number]- Gait training, [phone]- Orthotic Fit/training, [phone]- Canalith repositioning, [id number]- Aquatic Therapy, [phone]- Splinting, [phone]- Wound care (first 20 sq cm), 97598- Wound care (each additional 20 sq cm)Patient/Family education, Balance training, Stair training, Taping, Dry Needling, Joint mobilization, Joint manipulation, Spinal manipulation, Spinal mobilization, Scar mobilization, and DME instructions.  PLAN FOR NEXT SESSION: f/u with HEP, hip and core strength, postural strength, spinal mobility, manual for pain/mobility   Leigh Minerva III PT, DPT 01/29/24 8:22 PM

## 2024-01-29 ENCOUNTER — Encounter (HOSPITAL_BASED_OUTPATIENT_CLINIC_OR_DEPARTMENT_OTHER): Payer: Self-pay | Admitting: Physical Therapy

## 2024-01-30 ENCOUNTER — Encounter (HOSPITAL_BASED_OUTPATIENT_CLINIC_OR_DEPARTMENT_OTHER): Payer: Self-pay | Admitting: Physical Therapy

## 2024-01-30 ENCOUNTER — Ambulatory Visit (HOSPITAL_BASED_OUTPATIENT_CLINIC_OR_DEPARTMENT_OTHER): Payer: Worker's Compensation | Admitting: Physical Therapy

## 2024-01-30 DIAGNOSIS — M6281 Muscle weakness (generalized): Secondary | ICD-10-CM

## 2024-01-30 DIAGNOSIS — R29898 Other symptoms and signs involving the musculoskeletal system: Secondary | ICD-10-CM

## 2024-01-30 DIAGNOSIS — M5459 Other low back pain: Secondary | ICD-10-CM | POA: Diagnosis not present

## 2024-01-30 NOTE — Therapy (Signed)
 OUTPATIENT PHYSICAL THERAPY THORACOLUMBAR TREATMENT   Patient Name: Nicole Davila MRN: [id number] DOB:1960/07/23, 63 y.o., female Today's Date: 01/30/2024  END OF SESSION:  PT End of Session - 01/30/24 1200     Visit Number 9    Number of Visits 16    Date for Recertification  02/06/24    Authorization Type Worker's Comp    PT Start Time 1158    PT Stop Time 1240    PT Time Calculation (min) 42 min    Activity Tolerance Patient tolerated treatment well    Behavior During Therapy WFL for tasks assessed/performed               History reviewed. No pertinent past medical history. History reviewed. No pertinent surgical history. Patient Active Problem List   Diagnosis Date Noted   Pain in joint, ankle and foot 07/05/2016   Metatarsalgia of right foot 07/05/2016   Abnormality of gait 07/05/2016    PCP: Aisha Harvey, MD   REFERRING PROVIDER: Candance Jeoffrey SAILOR, PA-C  REFERRING DIAG: M54.50 (ICD-10-CM) - Low back pain, unspecified  Rationale for Evaluation and Treatment: Rehabilitation  THERAPY DIAG:  Other low back pain  Muscle weakness (generalized)  Other symptoms and signs involving the musculoskeletal system  ONSET DATE: June 2025  SUBJECTIVE:                                                                                                                                                                                           SUBJECTIVE STATEMENT: Pt denies any adverse effects after prior treatment.  Pt states she used some heat last night, and not even sure why.  Heat typically helps.  Pt reports she has been sitting a lot at work.  She reports having pain when standing up from sitting.   Pt is a member at [employer].  PERTINENT HISTORY:  CAD, GERD Restriction of lifting no > 10 lbs per PA note  PAIN:  Are you having pain? Yes: NPRS scale: 2/10 Pain location: R sided lumbar  Pain description: sharp Aggravating factors: lifting, standing,  sitting Relieving factors: heat  PRECAUTIONS: None  WEIGHT BEARING RESTRICTIONS: No  FALLS:  Has patient fallen in last 6 months? No  OCCUPATION: librarian  PLOF: Independent  PATIENT GOALS: Wants to know what she can do, Back to feel better   OBJECTIVE: (objective measures from initial evaluation unless otherwise dated)  PATIENT SURVEYS:  Modified Oswestry:  MODIFIED OSWESTRY DISABILITY SCALE  Date: 12/12/23 Score  Pain intensity 1 = The pain is bad, but I can manage without having to take (1) I can stand as long as I  want but, it increases my pain. pain medication.  2. Personal care (washing, dressing, etc.) 0 =  I can take care of myself normally without causing increased pain.  3. Lifting 4 = I can lift only very light weights  4. Walking 1 = Pain prevents me from walking more than 1 mile.  5. Sitting 3 =  Pain prevents me from sitting more than  hour.  6. Standing 1 =  I can stand as long as I want but, it increases my pain.  7. Sleeping 0 = Pain does not prevent me from sleeping well.  8. Social Life 0 = My social life is normal and does not increase my pain.  9. Traveling 1 =  I can travel anywhere, but it increases my pain.  10. Employment/ Homemaking 1 = My normal homemaking/job activities increase my pain, but I can still perform all that is required of me  Total 12/50   Interpretation of scores: Score Category Description  0-20% Minimal Disability The patient can cope with most living activities. Usually no treatment is indicated apart from advice on lifting, sitting and exercise  21-40% Moderate Disability The patient experiences more pain and difficulty with sitting, lifting and standing. Travel and social life are more difficult and they may be disabled from work. Personal care, sexual activity and sleeping are not grossly affected, and the patient can usually be managed by conservative means  41-60% Severe Disability Pain remains the main problem in this group,  but activities of daily living are affected. These patients require a detailed investigation  61-80% Crippled Back pain impinges on all aspects of the patient's life. Positive intervention is required  81-100% Bed-bound  These patients are either bed-bound or exaggerating their symptoms  Bluford FORBES [name], et al. Surgery versus conservative management of stable thoracolumbar fracture: the PRESTO feasibility RCT. [place] ([place]): [employer]; 2021 Nov. [facility] Technology Assessment, No. 25.62.) Appendix 3, Oswestry Disability Index category descriptors. Available from: Findjewelers.cz  Minimally Clinically Important Difference (MCID) = 12.8%  SCREENING FOR RED FLAGS: Bowel or bladder incontinence: No Spinal tumors: No Cauda equina syndrome: No Compression fracture: No Abdominal aneurysm: No  COGNITION: Overall cognitive status: Within functional limits for tasks assessed     SENSATION: WFL    POSTURE: decreased lumbar lordosis  PALPATION: Hypomobile grossly in thoracic and lumbar spine, tenderness mid thoracic and upper lumbar UPA; TTP bilateral but R>L paraspinals and QL, R glute tenderness; notes increased tenderness at R L5 paraspinals following testing  LUMBAR ROM: stretch with rotation bilateral  AROM eval  Flexion 0% limited  Extension 0% limited  Right lateral flexion 0% limited  Left lateral flexion 25% limited*  Right rotation 0% limited  Left rotation 0% limited   (Blank rows = not tested) * = pain/symptoms  LOWER EXTREMITY ROM:   WFL for tasks assessed  Active  Right eval Left eval  Hip flexion    Hip extension    Hip abduction    Hip adduction    Hip internal rotation    Hip external rotation    Knee flexion    Knee extension    Ankle dorsiflexion    Ankle plantarflexion    Ankle inversion    Ankle eversion     (Blank rows = not tested) * = pain/symptoms  LOWER EXTREMITY MMT:    MMT Right eval  Left eval  Hip flexion 4+ 4+  Hip extension 4- 4-  Hip abduction 4- *  4-  Hip adduction    Hip internal rotation    Hip external rotation    Knee flexion 5 5  Knee extension 5 5  Ankle dorsiflexion 5 5  Ankle plantarflexion    Ankle inversion    Ankle eversion     (Blank rows = not tested) * = pain/symptoms    FUNCTIONAL TESTS:  5 times sit to stand: 9.57 seconds without UE support, mild L >R  Squat/lifting mechanics: fair mechanics  GAIT: Distance walked: 100 feet Assistive device utilized: None Level of assistance: Complete Independence Comments: Mild R compensated trendelenberg  TODAY'S TREATMENT:                                                                                                                              DATE:  01/30/24 Supine SLR with contralateral UE extenion with TRA x10 each Supine alt hip extension arc with TrA x10, 6 reps Supine bridge with clams with RTB with TrA x 10 and with GTB x10 Sidestepping with TrA x 1 lap and with RTB above knees x 2 laps Standing alternating shoulder extension with TrA with RTB 2x10 Standing paloff press with TrA with RTB x 10 each  Manual Therapy:   STM and TPR to bilat sided lumbar paraspinals, and R QL in prone  PT educated pt using her access code to begin using the MedbridgeGO app    01/28/24 Reviewed pt presentation, HEP compliance, and pain level.  Supine alt UE/LE with TrA x12  Supine SLR with TrA x10 each Supine SLR with contralateral UE extenion with TRA x10 each Standing paloff press with TrA with RTB x 10 each Supine bridge with clams with RTB with TrA  x10 Standing alternating shoulder extension with TrA with RTB 2x10  Manual Therapy:   STM and TPR to R sided lumbar paraspinals, and R QL in prone   01/13/24 Reviewed pt presentation, response to prior treatment, HEP compliance, and pain level.    Manual Therapy:   STM and TPR to R sided lumbar paraspinals, R QL, and R glute in prone PT  educated pt and demonstrated using a theracane.  Pt used a theracane with PT instruction  Supine alt UE/LE x12 with TrA Supine SLR with TrA x 10 Supine SLR with contralateral UE extension with TrAx10 Supine bridge x 10, 2x10 with clams with RTB with TrA Standing shoulder extension with RTB with TrA 2x10 Paloff press with TrA with RTB x10 each   01/08/24 Reviewed pt presentation, response to prior treatment, HEP compliance, and pain level.   Supine alt UE/LE 2x10 Supine alt LE extension with TrA with UE's holding 2.2# ball 2x10 reps Supine clams with TrA with GTB 2x10  Supine SLR with TrA 2x10 each Standing rows with GTB 2x10 with TrA Standing shoulder extension with RTB with TrA 2x10 Supine modified thomas stretch 3x20 sec bilat  Manual Therapy:  STM and TPR to bilat lumbar paraspinals and to  QL in prone.     PATIENT EDUCATION:  Education details:  exercise form, HEP, POC, scope of PT, relevant anatomy, and rationale of interventions.  PT answered pt's questions. Person educated: Patient Education method: Explanation, Demonstration, verbal cues Education comprehension: verbalized understanding, returned demonstration, verbal cues required   HOME EXERCISE PROGRAM: Access Code: [email or web address] URL: [email or web address] Date: 12/12/2023 Prepared by: Prentice Zaunegger  Exercises - Supine Bridge  - 1 x daily - 7 x weekly - 2 sets - 10 reps - Supine Lower Trunk Rotation  - 2 x daily - 7 x weekly - 2 sets - 10 reps - Supine Transversus Abdominis Bracing - Hands on Stomach  - 2 x daily - 7 x weekly - 2 sets - 10 reps - 5 seconds hold - Supine March  - 1 x daily - 7 x weekly - 2 sets - 10 reps - Supine Core Control with Leg Extension  - 1 x daily - 7 x weekly - 2 sets - 10 reps - Hooklying Clamshell with Resistance  - 1 x daily - 5 x weekly - 2 sets - 10 reps - Standing Shoulder Row with Anchored Resistance  - 1 x daily - 4-5 x weekly - 2 sets - 10 reps - Modified Thomas  Stretch  - 1 x daily - 7 x weekly - 2-3 reps - 20 seconds hold  ASSESSMENT:  CLINICAL IMPRESSION: Pt performed exercises to improve lumbopelvic stabilization and strength.  PT progressed exercises by adding supine hip ext arc and lateral band walks with TrA.  Pt had questions concerning the MedbridgeGO app and PT assisted pt with setting it up on her phone.  PT performed STM to lumbar paraspinals and QL to improve soft tissue tightness and mobility and pain and to reduce myofascial restrictions.  She tolerated treatment well reporting improved pain to 1/10 after treatment.       OBJECTIVE IMPAIRMENTS: decreased activity tolerance, decreased balance, decreased endurance, decreased mobility, difficulty walking, decreased ROM, decreased strength, hypomobility, increased muscle spasms, impaired flexibility, improper body mechanics, postural dysfunction, and pain  ACTIVITY LIMITATIONS: carrying, lifting, bending, standing, squatting, stairs, transfers, bed mobility, reach over head, locomotion level, and caring for others  PARTICIPATION LIMITATIONS:  meal prep, cleaning, laundry, shopping, community activity, and yard work  PERSONAL FACTORS: Profession and Time since onset of injury/illness/exacerbation are also affecting patient's functional outcome.   REHAB POTENTIAL: Good  CLINICAL DECISION MAKING: Evolving/moderate complexity  EVALUATION COMPLEXITY: Moderate   GOALS: Goals reviewed with patient? Yes  SHORT TERM GOALS: Target date: 01/09/2024    Patient will be independent with HEP in order to improve functional outcomes. Baseline:  Goal status: INITIAL  2.  Patient will report at least 25% improvement in symptoms for improved quality of life. Baseline:  Goal status: INITIAL    LONG TERM GOALS: Target date: 02/06/2024    Patient will report at least 75% improvement in symptoms for improved quality of life. Baseline:  Goal status: INITIAL  2.  Patient will improve  Modified Oswestry score by at least 10 points in order to indicate improved tolerance to activity. Baseline: 12/50 Goal status: INITIAL  3.  Patient will demonstrate full  lumbar ROM in all restricted planes for improved ability to move trunk while completing chores/work duties. Baseline:  Goal status: INITIAL  4.  Patient will be able to return to all activities unrestricted for improved ability to perform work functions and  exercise.  Baseline:  Goal status: INITIAL  5.  Patient  will demonstrate grade of 5/5 MMT grade in all tested musculature as evidence of improved strength to assist with stair ambulation and gait.   Baseline:  Goal status: INITIAL   PLAN:  PT FREQUENCY: 1-2x/week  PT DURATION: 8 weeks  PLANNED INTERVENTIONS: [id number]- PT Re-evaluation, 97110-Therapeutic exercises, 97530- Therapeutic activity, [id number]- Neuromuscular re-education, 97535- Self Care, [id number]- Manual therapy, [id number]- Gait training, [phone]- Orthotic Fit/training, [phone]- Canalith repositioning, [id number]- Aquatic Therapy, [phone]- Splinting, [phone]- Wound care (first 20 sq cm), 97598- Wound care (each additional 20 sq cm)Patient/Family education, Balance training, Stair training, Taping, Dry Needling, Joint mobilization, Joint manipulation, Spinal manipulation, Spinal mobilization, Scar mobilization, and DME instructions.  PLAN FOR NEXT SESSION: f/u with HEP, hip and core strength, postural strength, spinal mobility, manual for pain/mobility   Leigh Minerva III PT, DPT 01/30/24 9:13 PM

## 2024-02-04 ENCOUNTER — Encounter (HOSPITAL_BASED_OUTPATIENT_CLINIC_OR_DEPARTMENT_OTHER): Payer: Self-pay | Admitting: Physical Therapy

## 2024-02-04 ENCOUNTER — Ambulatory Visit (HOSPITAL_BASED_OUTPATIENT_CLINIC_OR_DEPARTMENT_OTHER): Payer: Worker's Compensation | Attending: Physician Assistant | Admitting: Physical Therapy

## 2024-02-04 DIAGNOSIS — R29898 Other symptoms and signs involving the musculoskeletal system: Secondary | ICD-10-CM | POA: Insufficient documentation

## 2024-02-04 DIAGNOSIS — M6281 Muscle weakness (generalized): Secondary | ICD-10-CM | POA: Insufficient documentation

## 2024-02-04 DIAGNOSIS — M5459 Other low back pain: Secondary | ICD-10-CM | POA: Diagnosis present

## 2024-02-04 NOTE — Therapy (Signed)
 OUTPATIENT PHYSICAL THERAPY THORACOLUMBAR TREATMENT PROGRESS NOTE   Patient Name: Nicole Davila MRN: [id number] DOB:05-Nov-1960, 63 y.o., female Today's Date: 02/05/2024  END OF SESSION:  PT End of Session - 02/04/24 1413     Visit Number 10    Number of Visits 20    Date for Recertification  03/10/24    Authorization Type Worker's Comp    PT Start Time 1410    PT Stop Time 1452    PT Time Calculation (min) 42 min    Activity Tolerance Patient tolerated treatment well    Behavior During Therapy WFL for tasks assessed/performed               History reviewed. No pertinent past medical history. History reviewed. No pertinent surgical history. Patient Active Problem List   Diagnosis Date Noted   Pain in joint, ankle and foot 07/05/2016   Metatarsalgia of right foot 07/05/2016   Abnormality of gait 07/05/2016    PCP: Aisha Harvey, MD   REFERRING PROVIDER: Candance Jeoffrey SAILOR, PA-C  REFERRING DIAG: M54.50 (ICD-10-CM) - Low back pain, unspecified  Rationale for Evaluation and Treatment: Rehabilitation  THERAPY DIAG:  Other low back pain  Muscle weakness (generalized)  Other symptoms and signs involving the musculoskeletal system  ONSET DATE: June 2025  SUBJECTIVE:                                                                                                                                                                                           SUBJECTIVE STATEMENT: Pt denies any adverse effects after prior treatment, just a little soreness.  Pt reports compliance with HEP.  Pt is performing her walking program at [employer].  She also does her HEP at [facility].  I feel like I am a lot better than when I started though not where she wants to be yet.  Pt states she feels a lot better though still has pain.  Pt has pain with twisting movements.  Pt has pain with sitting, though not as much as she used to.  Pt reports she is not lifting.  Pt has pain with carrying  laundry basket.  Pt has pain with pushing the crates of chromebooks at work. Patient reports a 60% improvement in pain and symptoms.  Pt states her core is a little stronger.  She reports having pain when standing up from sitting.   Pt is a member at [employer].  PERTINENT HISTORY:  CAD, GERD Restriction of lifting no > 10 lbs per PA note  PAIN:  Are you having pain? Yes: NPRS scale: 3/10 current, 5-6/10 worst, 0/10 best Pain location: R  sided lumbar  Pain description: sharp Aggravating factors: standing 10-15 mins, sitting.  Worst pain is with twisting Relieving factors: heat, walking  PRECAUTIONS: None  WEIGHT BEARING RESTRICTIONS: No  FALLS:  Has patient fallen in last 6 months? No  OCCUPATION: librarian  PLOF: Independent  PATIENT GOALS: Wants to know what she can do, Back to feel better   OBJECTIVE: (objective measures from initial evaluation unless otherwise dated)  PATIENT SURVEYS:  Modified Oswestry:  MODIFIED OSWESTRY DISABILITY SCALE   Date: 12/12/23 Score 11/11  Pain intensity 1 = The pain is bad, but I can manage without having to take (1) I can stand as long as I want but, it increases my pain. pain medication. 0   2. Personal care (washing, dressing, etc.) 0 =  I can take care of myself normally without causing increased pain. 1  3. Lifting 4 = I can lift only very light weights 4  4. Walking 1 = Pain prevents me from walking more than 1 mile. 0  5. Sitting 3 =  Pain prevents me from sitting more than  hour. 2  6. Standing 1 =  I can stand as long as I want but, it increases my pain. 2  7. Sleeping 0 = Pain does not prevent me from sleeping well. 0  8. Social Life 0 = My social life is normal and does not increase my pain. 0  9. Traveling 1 =  I can travel anywhere, but it increases my pain. 1  10. Employment/ Homemaking 1 = My normal homemaking/job activities increase my pain, but I can still perform all that is required of me 1  Total 12/50 11/50 = 22%    Interpretation of scores: Score Category Description  0-20% Minimal Disability The patient can cope with most living activities. Usually no treatment is indicated apart from advice on lifting, sitting and exercise  21-40% Moderate Disability The patient experiences more pain and difficulty with sitting, lifting and standing. Travel and social life are more difficult and they may be disabled from work. Personal care, sexual activity and sleeping are not grossly affected, and the patient can usually be managed by conservative means  41-60% Severe Disability Pain remains the main problem in this group, but activities of daily living are affected. These patients require a detailed investigation  61-80% Crippled Back pain impinges on all aspects of the patient's life. Positive intervention is required  81-100% Bed-bound  These patients are either bed-bound or exaggerating their symptoms  Bluford FORBES [name], et al. Surgery versus conservative management of stable thoracolumbar fracture: the PRESTO feasibility RCT. [place] ([place]): [employer]; 2021 Nov. [facility] Technology Assessment, No. 25.62.) Appendix 3, Oswestry Disability Index category descriptors. Available from: Findjewelers.cz  Minimally Clinically Important Difference (MCID) = 12.8%  SCREENING FOR RED FLAGS: Bowel or bladder incontinence: No Spinal tumors: No Cauda equina syndrome: No Compression fracture: No Abdominal aneurysm: No  COGNITION: Overall cognitive status: Within functional limits for tasks assessed     SENSATION: WFL    POSTURE: decreased lumbar lordosis  PALPATION: Hypomobile grossly in thoracic and lumbar spine, tenderness mid thoracic and upper lumbar UPA; TTP bilateral but R>L paraspinals and QL, R glute tenderness; notes increased tenderness at R L5 paraspinals following testing  LUMBAR ROM: stretch with rotation bilateral  AROM eval 11/11  Flexion 0%  limited WNL  Extension 0% limited WFL  Right lateral flexion 0% limited 10% limited   Left lateral flexion 25% limited* 25% limited  Right rotation 0% limited 25% limited  Left rotation 0% limited WFL   (Blank rows = not tested) * = pain/symptoms  LOWER EXTREMITY ROM:   WFL for tasks assessed  Active  Right eval Left eval  Hip flexion    Hip extension    Hip abduction    Hip adduction    Hip internal rotation    Hip external rotation    Knee flexion    Knee extension    Ankle dorsiflexion    Ankle plantarflexion    Ankle inversion    Ankle eversion     (Blank rows = not tested) * = pain/symptoms  LOWER EXTREMITY MMT:    MMT Right eval Left eval Right 11/11 Left 11/11  Hip flexion 4+ 4+ 4/5 4+/5  Hip extension 4- 4- 4-/5 4+/5  Hip abduction 4- * 4- 3+/5 4-/5  Hip adduction      Hip internal rotation      Hip external rotation      Knee flexion 5 5 5/5 5/5  Knee extension 5 5 4/5 4/5  Ankle dorsiflexion 5 5    Ankle plantarflexion      Ankle inversion      Ankle eversion       (Blank rows = not tested) * = pain/symptoms    FUNCTIONAL TESTS:  5 times sit to stand: 9.57 seconds without UE support, mild L >R  Squat/lifting mechanics: fair mechanics  GAIT: Distance walked: 100 feet Assistive device utilized: None Level of assistance: Complete Independence Comments: Mild R compensated trendelenberg  TODAY'S TREATMENT:                                                                                                                              DATE:  02/04/24 Reviewed current function, home management strategies, pain levels, and response to prior treatment.  PT assessed lumbar AROM and LE strength.  See above Pt completed modified Oswestry. See above  Lateral band walks with RTB above knees x 3 laps at rail Mini squats with TrA with UE support at rail x 10 reps Standing paloff press with TrA with RTB x 10 each Supine alt hip extension arc with TrA x10  reps   01/30/24 Supine SLR with contralateral UE extenion with TRA x10 each Supine alt hip extension arc with TrA x10, 6 reps Supine bridge with clams with RTB with TrA x 10 and with GTB x10 Sidestepping with TrA x 1 lap and with RTB above knees x 2 laps Standing alternating shoulder extension with TrA with RTB 2x10 Standing paloff press with TrA with RTB x 10 each  Manual Therapy:   STM and TPR to bilat sided lumbar paraspinals, and R QL in prone  PT educated pt using her access code to begin using the Medbridge[employer] app    01/28/24 Reviewed pt presentation, HEP compliance, and pain level.  Supine alt UE/LE with TrA x12  Supine SLR with TrA  x10 each Supine SLR with contralateral UE extenion with TRA x10 each Standing paloff press with TrA with RTB x 10 each Supine bridge with clams with RTB with TrA  x10 Standing alternating shoulder extension with TrA with RTB 2x10  Manual Therapy:   STM and TPR to R sided lumbar paraspinals, and R QL in prone   01/13/24 Reviewed pt presentation, response to prior treatment, HEP compliance, and pain level.    Manual Therapy:   STM and TPR to R sided lumbar paraspinals, R QL, and R glute in prone PT educated pt and demonstrated using a theracane.  Pt used a theracane with PT instruction  Supine alt UE/LE x12 with TrA Supine SLR with TrA x 10 Supine SLR with contralateral UE extension with TrAx10 Supine bridge x 10, 2x10 with clams with RTB with TrA Standing shoulder extension with RTB with TrA 2x10 Paloff press with TrA with RTB x10 each    PATIENT EDUCATION:  Education details:  exercise form, HEP, POC, scope of PT, relevant anatomy, and rationale of interventions.  PT answered pt's questions. Person educated: Patient Education method: Explanation, Demonstration, verbal cues Education comprehension: verbalized understanding, returned demonstration, verbal cues required   HOME EXERCISE PROGRAM: Access Code: [email or web address] URL:  [email or web address] Date: 12/12/2023 Prepared by: Prentice Zaunegger  Exercises - Supine Bridge  - 1 x daily - 7 x weekly - 2 sets - 10 reps - Supine Lower Trunk Rotation  - 2 x daily - 7 x weekly - 2 sets - 10 reps - Supine Transversus Abdominis Bracing - Hands on Stomach  - 2 x daily - 7 x weekly - 2 sets - 10 reps - 5 seconds hold - Supine March  - 1 x daily - 7 x weekly - 2 sets - 10 reps - Supine Core Control with Leg Extension  - 1 x daily - 7 x weekly - 2 sets - 10 reps - Hooklying Clamshell with Resistance  - 1 x daily - 5 x weekly - 2 sets - 10 reps - Standing Shoulder Row with Anchored Resistance  - 1 x daily - 4-5 x weekly - 2 sets - 10 reps - Modified Thomas Stretch  - 1 x daily - 7 x weekly - 2-3 reps - 20 seconds hold  ASSESSMENT:  CLINICAL IMPRESSION: Pt reports she is doing much better than when she started PT.  She continues to have pain though is feeling better.  Patient reports a 60% improvement in pain and symptoms.  She has pain with with twisting movements and also pushing the crates of chromebooks at work.  Pt has pain with sitting, though not as much as she used to.  Pt reports she is not lifting and has pain with carrying laundry basket.  Pt is actually weaker in R > LE with MMT though did have improved L hip extension strength.  She is improving with core strength as evidenced by performance and progression of exercises.  Pt had worse lumbar AROM in R Sb'ing R rotation.  Pt had slight improvement in self perceived disability with Modified Oswestry improving from 24% initially to 22% currently, though not clinically significant.  Pt has met all STG's.  She should benefit from cont skilled PT services to address ongoing goals and to assist in restoring pt's PLOF.         OBJECTIVE IMPAIRMENTS: decreased activity tolerance, decreased balance, decreased endurance, decreased mobility, difficulty walking, decreased ROM, decreased strength, hypomobility,  increased muscle spasms, impaired flexibility,  improper body mechanics, postural dysfunction, and pain  ACTIVITY LIMITATIONS: carrying, lifting, bending, standing, squatting, stairs, transfers, bed mobility, reach over head, locomotion level, and caring for others  PARTICIPATION LIMITATIONS:  meal prep, cleaning, laundry, shopping, community activity, and yard work  PERSONAL FACTORS: Profession and Time since onset of injury/illness/exacerbation are also affecting patient's functional outcome.   REHAB POTENTIAL: Good  CLINICAL DECISION MAKING: Evolving/moderate complexity  EVALUATION COMPLEXITY: Moderate   GOALS: Goals reviewed with patient? Yes  SHORT TERM GOALS: Target date: 01/09/2024    Patient will be independent with HEP in order to improve functional outcomes. Baseline:  Goal status: GOAL MET  11/11  2.  Patient will report at least 25% improvement in symptoms for improved quality of life. Baseline:  Goal status: GOAL MET  11/11    LONG TERM GOALS: Target date: 03/10/2024    Patient will report at least 75% improvement in symptoms for improved quality of life. Baseline:  Goal status:  PROGRESSING   2.  Patient will improve Modified Oswestry score by at least 10 points in order to indicate improved tolerance to activity. Baseline: 12/50 Goal status: ONGOING  3.  Patient will demonstrate full lumbar ROM in all restricted planes for improved ability to move trunk while completing chores/work duties. Baseline:  Goal status: NOT MET  4.  Patient will be able to return to all activities unrestricted for improved ability to perform work functions and  exercise.  Baseline:  Goal status: ONGOING  5.  Patient will demonstrate grade of 5/5 MMT grade in all tested musculature as evidence of improved strength to assist with stair ambulation and gait.   Baseline:  Goal status: NOT MET   PLAN:  PT FREQUENCY: 1-2x/week  PT DURATION:  5 weeks   PLANNED  INTERVENTIONS: 97164- PT Re-evaluation, 97110-Therapeutic exercises, 97530- Therapeutic activity, [id number]- Neuromuscular re-education, 97535- Self Care, [id number]- Manual therapy, [id number]- Gait training, [phone]- Orthotic Fit/training, [phone]- Canalith repositioning, [id number]- Aquatic Therapy, [phone]- Splinting, [phone]- Wound care (first 20 sq cm), 97598- Wound care (each additional 20 sq cm)Patient/Family education, Balance training, Stair training, Taping, Dry Needling, Joint mobilization, Joint manipulation, Spinal manipulation, Spinal mobilization, Scar mobilization, and DME instructions.  PLAN FOR NEXT SESSION: f/u with HEP, hip and core strength, postural strength, spinal mobility, manual for pain/mobility   Leigh Minerva III PT, DPT 02/05/24 8:03 PM

## 2024-02-06 ENCOUNTER — Ambulatory Visit (HOSPITAL_BASED_OUTPATIENT_CLINIC_OR_DEPARTMENT_OTHER): Payer: Worker's Compensation | Admitting: Physical Therapy

## 2024-02-12 ENCOUNTER — Encounter (HOSPITAL_BASED_OUTPATIENT_CLINIC_OR_DEPARTMENT_OTHER): Payer: Self-pay | Admitting: Physical Therapy

## 2024-02-12 ENCOUNTER — Ambulatory Visit (HOSPITAL_BASED_OUTPATIENT_CLINIC_OR_DEPARTMENT_OTHER): Payer: Worker's Compensation | Admitting: Physical Therapy

## 2024-02-12 DIAGNOSIS — R29898 Other symptoms and signs involving the musculoskeletal system: Secondary | ICD-10-CM

## 2024-02-12 DIAGNOSIS — M6281 Muscle weakness (generalized): Secondary | ICD-10-CM

## 2024-02-12 DIAGNOSIS — M5459 Other low back pain: Secondary | ICD-10-CM

## 2024-02-12 NOTE — Therapy (Signed)
 OUTPATIENT PHYSICAL THERAPY THORACOLUMBAR TREATMENT PROGRESS NOTE   Patient Name: Nicole Davila MRN: [id number] DOB:Aug 05, 1960, 63 y.o., female Today's Date: 02/12/2024  END OF SESSION:         No past medical history on file. No past surgical history on file. Patient Active Problem List   Diagnosis Date Noted   Pain in joint, ankle and foot 07/05/2016   Metatarsalgia of right foot 07/05/2016   Abnormality of gait 07/05/2016    PCP: Aisha Harvey, MD   REFERRING PROVIDER: Candance Jeoffrey SAILOR, PA-C  REFERRING DIAG: M54.50 (ICD-10-CM) - Low back pain, unspecified  Rationale for Evaluation and Treatment: Rehabilitation  THERAPY DIAG:  No diagnosis found.  ONSET DATE: June 2025  SUBJECTIVE:                                                                                                                                                                                           SUBJECTIVE STATEMENT: Pt denies any adverse effects after prior treatment.  Pt states her back didn't feel good over the weekend.  She went to the beach and not sure if it was due to sleeping on a different mattress or walking on different surfaces.  Pt feels better today.  Pt wasn't able to perform her home exercises due to being out of town with friends.      just a little soreness.  Pt reports compliance with HEP.  Pt is performing her walking program at [employer].  She also does her HEP at [facility].  I feel like I am a lot better than when I started though not where she wants to be yet.  Pt states she feels a lot better though still has pain.  Pt has pain with twisting movements.  Pt has pain with sitting, though not as much as she used to.  Pt reports she is not lifting.  Pt has pain with carrying laundry basket.  Pt has pain with pushing the crates of chromebooks at work. Patient reports a 60% improvement in pain and symptoms.  Pt states her core is a little stronger.  She reports having pain when  standing up from sitting.   Pt is a member at [employer].  PERTINENT HISTORY:  CAD, GERD Restriction of lifting no > 10 lbs per PA note  PAIN:  Are you having pain? Yes: NPRS scale: 1/10 current, 5-6/10 worst, 0/10 best Pain location: central lumbar, L > R sided lumbar  Pain description: sharp Aggravating factors: standing 10-15 mins, sitting.  Worst pain is with twisting Relieving factors: heat, walking  PRECAUTIONS: None  WEIGHT BEARING RESTRICTIONS: No  FALLS:  Has  patient fallen in last 6 months? No  OCCUPATION: librarian  PLOF: Independent  PATIENT GOALS: Wants to know what she can do, Back to feel better   OBJECTIVE: (objective measures from initial evaluation unless otherwise dated)  PATIENT SURVEYS:  Modified Oswestry:  MODIFIED OSWESTRY DISABILITY SCALE   Date: 12/12/23 Score 11/11  Pain intensity 1 = The pain is bad, but I can manage without having to take (1) I can stand as long as I want but, it increases my pain. pain medication. 0   2. Personal care (washing, dressing, etc.) 0 =  I can take care of myself normally without causing increased pain. 1  3. Lifting 4 = I can lift only very light weights 4  4. Walking 1 = Pain prevents me from walking more than 1 mile. 0  5. Sitting 3 =  Pain prevents me from sitting more than  hour. 2  6. Standing 1 =  I can stand as long as I want but, it increases my pain. 2  7. Sleeping 0 = Pain does not prevent me from sleeping well. 0  8. Social Life 0 = My social life is normal and does not increase my pain. 0  9. Traveling 1 =  I can travel anywhere, but it increases my pain. 1  10. Employment/ Homemaking 1 = My normal homemaking/job activities increase my pain, but I can still perform all that is required of me 1  Total 12/50 11/50 = 22%   Interpretation of scores: Score Category Description  0-20% Minimal Disability The patient can cope with most living activities. Usually no treatment is indicated apart from advice  on lifting, sitting and exercise  21-40% Moderate Disability The patient experiences more pain and difficulty with sitting, lifting and standing. Travel and social life are more difficult and they may be disabled from work. Personal care, sexual activity and sleeping are not grossly affected, and the patient can usually be managed by conservative means  41-60% Severe Disability Pain remains the main problem in this group, but activities of daily living are affected. These patients require a detailed investigation  61-80% Crippled Back pain impinges on all aspects of the patient's life. Positive intervention is required  81-100% Bed-bound  These patients are either bed-bound or exaggerating their symptoms  Bluford FORBES [name], et al. Surgery versus conservative management of stable thoracolumbar fracture: the PRESTO feasibility RCT. [place] ([place]): [employer]; 2021 Nov. [facility] Technology Assessment, No. 25.62.) Appendix 3, Oswestry Disability Index category descriptors. Available from: Findjewelers.cz  Minimally Clinically Important Difference (MCID) = 12.8%  SCREENING FOR RED FLAGS: Bowel or bladder incontinence: No Spinal tumors: No Cauda equina syndrome: No Compression fracture: No Abdominal aneurysm: No  COGNITION: Overall cognitive status: Within functional limits for tasks assessed     SENSATION: WFL    POSTURE: decreased lumbar lordosis  PALPATION: Hypomobile grossly in thoracic and lumbar spine, tenderness mid thoracic and upper lumbar UPA; TTP bilateral but R>L paraspinals and QL, R glute tenderness; notes increased tenderness at R L5 paraspinals following testing  LUMBAR ROM: stretch with rotation bilateral  AROM eval 11/11  Flexion 0% limited WNL  Extension 0% limited WFL  Right lateral flexion 0% limited 10% limited   Left lateral flexion 25% limited* 25% limited  Right rotation 0% limited 25% limited  Left  rotation 0% limited WFL   (Blank rows = not tested) * = pain/symptoms  LOWER EXTREMITY ROM:   WFL for tasks assessed  Active  Right eval Left eval  Hip flexion    Hip extension    Hip abduction    Hip adduction    Hip internal rotation    Hip external rotation    Knee flexion    Knee extension    Ankle dorsiflexion    Ankle plantarflexion    Ankle inversion    Ankle eversion     (Blank rows = not tested) * = pain/symptoms  LOWER EXTREMITY MMT:    MMT Right eval Left eval Right 11/11 Left 11/11  Hip flexion 4+ 4+ 4/5 4+/5  Hip extension 4- 4- 4-/5 4+/5  Hip abduction 4- * 4- 3+/5 4-/5  Hip adduction      Hip internal rotation      Hip external rotation      Knee flexion 5 5 5/5 5/5  Knee extension 5 5 4/5 4/5  Ankle dorsiflexion 5 5    Ankle plantarflexion      Ankle inversion      Ankle eversion       (Blank rows = not tested) * = pain/symptoms    FUNCTIONAL TESTS:  5 times sit to stand: 9.57 seconds without UE support, mild L >R  Squat/lifting mechanics: fair mechanics  GAIT: Distance walked: 100 feet Assistive device utilized: None Level of assistance: Complete Independence Comments: Mild R compensated trendelenberg  TODAY'S TREATMENT:                                                                                                                              DATE:  02/12/24 Manual Therapy:   STM and TPR to bilat sided lumbar paraspinals, and R QL in prone  Supine SLR with contralateral UE extenion with TRA 2x10 each Supine hip extension arc 2x10 Attempted q-ped birddogs Q-ped alt UE flexion with TrA x 10 Q-ped alt hip extension with TrA x 8 Standing paloff press with TrA with RTB 2x10 each Lateral band walks with RTB above knees x 3 laps at rail Mini squats with TrA with UE support 2x10   02/04/24 Reviewed current function, home management strategies, pain levels, and response to prior treatment.  PT assessed lumbar AROM and LE strength.  See  above Pt completed modified Oswestry. See above  Lateral band walks with RTB above knees x 3 laps at rail Mini squats with TrA with UE support at rail x 10 reps Standing paloff press with TrA with RTB x 10 each Supine alt hip extension arc with TrA x10 reps   01/30/24 Supine SLR with contralateral UE extenion with TRA x10 each Supine alt hip extension arc with TrA x10, 6 reps Supine bridge with clams with RTB with TrA x 10 and with GTB x10 Sidestepping with TrA x 1 lap and with RTB above knees x 2 laps Standing alternating shoulder extension with TrA with RTB 2x10 Standing paloff press with TrA with RTB x 10 each  Manual Therapy:  STM and TPR to bilat sided lumbar paraspinals, and R QL in prone  PT educated pt using her access code to begin using the Medbridge[employer] app    01/28/24 Reviewed pt presentation, HEP compliance, and pain level.  Supine alt UE/LE with TrA x12  Supine SLR with TrA x10 each Supine SLR with contralateral UE extenion with TRA x10 each Standing paloff press with TrA with RTB x 10 each Supine bridge with clams with RTB with TrA  x10 Standing alternating shoulder extension with TrA with RTB 2x10  Manual Therapy:   STM and TPR to R sided lumbar paraspinals, and R QL in prone   01/13/24 Reviewed pt presentation, response to prior treatment, HEP compliance, and pain level.    Manual Therapy:   STM and TPR to R sided lumbar paraspinals, R QL, and R glute in prone PT educated pt and demonstrated using a theracane.  Pt used a theracane with PT instruction  Supine alt UE/LE x12 with TrA Supine SLR with TrA x 10 Supine SLR with contralateral UE extension with TrAx10 Supine bridge x 10, 2x10 with clams with RTB with TrA Standing shoulder extension with RTB with TrA 2x10 Paloff press with TrA with RTB x10 each    PATIENT EDUCATION:  Education details:  exercise form, HEP, POC, scope of PT, relevant anatomy, and rationale of interventions.  PT answered pt's  questions. Person educated: Patient Education method: Explanation, Demonstration, verbal cues Education comprehension: verbalized understanding, returned demonstration, verbal cues required   HOME EXERCISE PROGRAM: Access Code: [email or web address] URL: [email or web address] Date: 12/12/2023 Prepared by: Prentice Zaunegger  Exercises - Supine Bridge  - 1 x daily - 7 x weekly - 2 sets - 10 reps - Supine Lower Trunk Rotation  - 2 x daily - 7 x weekly - 2 sets - 10 reps - Supine Transversus Abdominis Bracing - Hands on Stomach  - 2 x daily - 7 x weekly - 2 sets - 10 reps - 5 seconds hold - Supine March  - 1 x daily - 7 x weekly - 2 sets - 10 reps - Supine Core Control with Leg Extension  - 1 x daily - 7 x weekly - 2 sets - 10 reps - Hooklying Clamshell with Resistance  - 1 x daily - 5 x weekly - 2 sets - 10 reps - Standing Shoulder Row with Anchored Resistance  - 1 x daily - 4-5 x weekly - 2 sets - 10 reps - Modified Thomas Stretch  - 1 x daily - 7 x weekly - 2-3 reps - 20 seconds hold  ASSESSMENT:  CLINICAL IMPRESSION: Pt reports she is doing much better than when she started PT.  She continues to have pain though is feeling better.  Patient reports a 60% improvement in pain and symptoms.  She has pain with with twisting movements and also pushing the crates of chromebooks at work.  Pt has pain with sitting, though not as much as she used to.  Pt reports she is not lifting and has pain with carrying laundry basket.  Pt is actually weaker in R > LE with MMT though did have improved L hip extension strength.  She is improving with core strength as evidenced by performance and progression of exercises.  Pt had worse lumbar AROM in R Sb'ing R rotation.  Pt had slight improvement in self perceived disability with Modified Oswestry improving from 24% initially to 22% currently, though not clinically significant.  Pt has met all STG's.  She should benefit from cont skilled PT services to address  ongoing goals and to assist in restoring pt's PLOF.       Attempted q-ped birddogs though pt unable to maintain balance.  Pt could feel it in her wrists with Q-ped exercises.  Pt was challenged with alt hip extension in q-ped as evidenced by wt shift and lean.    OBJECTIVE IMPAIRMENTS: decreased activity tolerance, decreased balance, decreased endurance, decreased mobility, difficulty walking, decreased ROM, decreased strength, hypomobility, increased muscle spasms, impaired flexibility, improper body mechanics, postural dysfunction, and pain  ACTIVITY LIMITATIONS: carrying, lifting, bending, standing, squatting, stairs, transfers, bed mobility, reach over head, locomotion level, and caring for others  PARTICIPATION LIMITATIONS:  meal prep, cleaning, laundry, shopping, community activity, and yard work  PERSONAL FACTORS: Profession and Time since onset of injury/illness/exacerbation are also affecting patient's functional outcome.   REHAB POTENTIAL: Good  CLINICAL DECISION MAKING: Evolving/moderate complexity  EVALUATION COMPLEXITY: Moderate   GOALS: Goals reviewed with patient? Yes  SHORT TERM GOALS: Target date: 01/09/2024    Patient will be independent with HEP in order to improve functional outcomes. Baseline:  Goal status: GOAL MET  11/11  2.  Patient will report at least 25% improvement in symptoms for improved quality of life. Baseline:  Goal status: GOAL MET  11/11    LONG TERM GOALS: Target date: 03/10/2024    Patient will report at least 75% improvement in symptoms for improved quality of life. Baseline:  Goal status:  PROGRESSING   2.  Patient will improve Modified Oswestry score by at least 10 points in order to indicate improved tolerance to activity. Baseline: 12/50 Goal status: ONGOING  3.  Patient will demonstrate full lumbar ROM in all restricted planes for improved ability to move trunk while completing chores/work duties. Baseline:  Goal status: NOT  MET  4.  Patient will be able to return to all activities unrestricted for improved ability to perform work functions and  exercise.  Baseline:  Goal status: ONGOING  5.  Patient will demonstrate grade of 5/5 MMT grade in all tested musculature as evidence of improved strength to assist with stair ambulation and gait.   Baseline:  Goal status: NOT MET   PLAN:  PT FREQUENCY: 1-2x/week  PT DURATION:  5 weeks   PLANNED INTERVENTIONS: [id number]- PT Re-evaluation, 97110-Therapeutic exercises, 97530- Therapeutic activity, [id number]- Neuromuscular re-education, 97535- Self Care, [id number]- Manual therapy, [id number]- Gait training, [phone]- Orthotic Fit/training, [phone]- Canalith repositioning, [id number]- Aquatic Therapy, [phone]- Splinting, [phone]- Wound care (first 20 sq cm), 97598- Wound care (each additional 20 sq cm)Patient/Family education, Balance training, Stair training, Taping, Dry Needling, Joint mobilization, Joint manipulation, Spinal manipulation, Spinal mobilization, Scar mobilization, and DME instructions.  PLAN FOR NEXT SESSION: f/u with HEP, hip and core strength, postural strength, spinal mobility, manual for pain/mobility   Leigh Minerva III PT, DPT 02/12/24 1:23 PM

## 2024-02-18 ENCOUNTER — Encounter (HOSPITAL_BASED_OUTPATIENT_CLINIC_OR_DEPARTMENT_OTHER): Payer: Self-pay | Admitting: Physical Therapy

## 2024-02-18 ENCOUNTER — Ambulatory Visit (HOSPITAL_BASED_OUTPATIENT_CLINIC_OR_DEPARTMENT_OTHER): Payer: Worker's Compensation | Admitting: Physical Therapy

## 2024-02-18 DIAGNOSIS — M6281 Muscle weakness (generalized): Secondary | ICD-10-CM

## 2024-02-18 DIAGNOSIS — R29898 Other symptoms and signs involving the musculoskeletal system: Secondary | ICD-10-CM

## 2024-02-18 DIAGNOSIS — M5459 Other low back pain: Secondary | ICD-10-CM | POA: Diagnosis not present

## 2024-02-18 NOTE — Therapy (Signed)
 OUTPATIENT PHYSICAL THERAPY THORACOLUMBAR TREATMENT    Patient Name: Nicole Davila MRN: [id number] DOB:Jul 09, 1960, 63 y.o., female Today's Date: 02/19/2024  END OF SESSION:  PT End of Session - 02/18/24 1627     Visit Number 12    Number of Visits 20    Date for Recertification  03/10/24    Authorization Type Worker's Comp    PT Start Time 1623    PT Stop Time 1704    PT Time Calculation (min) 41 min    Activity Tolerance Patient tolerated treatment well    Behavior During Therapy [facility] for tasks assessed/performed                History reviewed. No pertinent past medical history. History reviewed. No pertinent surgical history. Patient Active Problem List   Diagnosis Date Noted   Pain in joint, ankle and foot 07/05/2016   Metatarsalgia of right foot 07/05/2016   Abnormality of gait 07/05/2016    PCP: Nicole Harvey, MD   REFERRING PROVIDER: Candance Jeoffrey SAILOR, PA-C  REFERRING DIAG: M54.50 (ICD-10-CM) - Low back pain, unspecified  Rationale for Evaluation and Treatment: Rehabilitation  THERAPY DIAG:  Other low back pain  Muscle weakness (generalized)  Other symptoms and signs involving the musculoskeletal system  ONSET DATE: June 2025  SUBJECTIVE:                                                                                                                                                                                           SUBJECTIVE STATEMENT: Pt denies any adverse effects after prior treatment.  Pt reports having increased back pain with plane travel and sitting at the wedding.  Pt reports she feels about the same, no recent improvement.  Pt has increased pain with sitting.     Pt is a member at [employer].  PERTINENT HISTORY:  CAD, GERD Restriction of lifting no > 10 lbs per PA note  PAIN:  Are you having pain? Yes: NPRS scale: 0/10 current, 5-6/10 worst, 0/10 best Pain location: central lumbar, L > R sided lumbar  Pain description:  sharp Aggravating factors: standing 10-15 mins, sitting.  Worst pain is with twisting Relieving factors: heat, walking  PRECAUTIONS: None  WEIGHT BEARING RESTRICTIONS: No  FALLS:  Has patient fallen in last 6 months? No  OCCUPATION: librarian  PLOF: Independent  PATIENT GOALS: Wants to know what she can do, Back to feel better   OBJECTIVE: (objective measures from initial evaluation unless otherwise dated)  PATIENT SURVEYS:  Modified Oswestry:  MODIFIED OSWESTRY DISABILITY SCALE   Date: 12/12/23 Score 11/11  Pain intensity 1 = The pain is  bad, but I can manage without having to take (1) I can stand as long as I want but, it increases my pain. pain medication. 0   2. Personal care (washing, dressing, etc.) 0 =  I can take care of myself normally without causing increased pain. 1  3. Lifting 4 = I can lift only very light weights 4  4. Walking 1 = Pain prevents me from walking more than 1 mile. 0  5. Sitting 3 =  Pain prevents me from sitting more than  hour. 2  6. Standing 1 =  I can stand as long as I want but, it increases my pain. 2  7. Sleeping 0 = Pain does not prevent me from sleeping well. 0  8. Social Life 0 = My social life is normal and does not increase my pain. 0  9. Traveling 1 =  I can travel anywhere, but it increases my pain. 1  10. Employment/ Homemaking 1 = My normal homemaking/job activities increase my pain, but I can still perform all that is required of me 1  Total 12/50 11/50 = 22%   Interpretation of scores: Score Category Description  0-20% Minimal Disability The patient can cope with most living activities. Usually no treatment is indicated apart from advice on lifting, sitting and exercise  21-40% Moderate Disability The patient experiences more pain and difficulty with sitting, lifting and standing. Travel and social life are more difficult and they may be disabled from work. Personal care, sexual activity and sleeping are not grossly affected, and  the patient can usually be managed by conservative means  41-60% Severe Disability Pain remains the main problem in this group, but activities of daily living are affected. These patients require a detailed investigation  61-80% Crippled Back pain impinges on all aspects of the patient's life. Positive intervention is required  81-100% Bed-bound  These patients are either bed-bound or exaggerating their symptoms  Nicole Davila, et al. Surgery versus conservative management of stable thoracolumbar fracture: the PRESTO feasibility RCT. [place] ([place]): [employer]; 2021 Nov. [facility] Technology Assessment, No. 25.62.) Appendix 3, Oswestry Disability Index category descriptors. Available from: Findjewelers.cz  Minimally Clinically Important Difference (MCID) = 12.8%  SCREENING FOR RED FLAGS: Bowel or bladder incontinence: No Spinal tumors: No Cauda equina syndrome: No Compression fracture: No Abdominal aneurysm: No  COGNITION: Overall cognitive status: Within functional limits for tasks assessed     SENSATION: WFL    POSTURE: decreased lumbar lordosis  PALPATION: Hypomobile grossly in thoracic and lumbar spine, tenderness mid thoracic and upper lumbar UPA; TTP bilateral but R>L paraspinals and QL, R glute tenderness; notes increased tenderness at R L5 paraspinals following testing  LUMBAR ROM: stretch with rotation bilateral  AROM eval 11/11  Flexion 0% limited WNL  Extension 0% limited WFL  Right lateral flexion 0% limited 10% limited   Left lateral flexion 25% limited* 25% limited  Right rotation 0% limited 25% limited  Left rotation 0% limited WFL   (Blank rows = not tested) * = pain/symptoms  LOWER EXTREMITY ROM:   WFL for tasks assessed  Active  Right eval Left eval  Hip flexion    Hip extension    Hip abduction    Hip adduction    Hip internal rotation    Hip external rotation    Knee flexion    Knee  extension    Ankle dorsiflexion    Ankle plantarflexion    Ankle inversion  Ankle eversion     (Blank rows = not tested) * = pain/symptoms  LOWER EXTREMITY MMT:    MMT Right eval Left eval Right 11/11 Left 11/11  Hip flexion 4+ 4+ 4/5 4+/5  Hip extension 4- 4- 4-/5 4+/5  Hip abduction 4- * 4- 3+/5 4-/5  Hip adduction      Hip internal rotation      Hip external rotation      Knee flexion 5 5 5/5 5/5  Knee extension 5 5 4/5 4/5  Ankle dorsiflexion 5 5    Ankle plantarflexion      Ankle inversion      Ankle eversion       (Blank rows = not tested) * = pain/symptoms    FUNCTIONAL TESTS:  5 times sit to stand: 9.57 seconds without UE support, mild L >R  Squat/lifting mechanics: fair mechanics  GAIT: Distance walked: 100 feet Assistive device utilized: None Level of assistance: Complete Independence Comments: Mild R compensated trendelenberg  TODAY'S TREATMENT:                                                                                                                              DATE:  02/18/24 Reviewed pt presentation, pain level, response to prior treatment, and HEP compliance.  Pt ambulated 3 laps in the clinic to warm up. Supine SLR with contralateral UE extenion with TrA 2x10 each Supine dead bug with feet unsupported x8,6 reps Q-ped alt UE flexion with TrA x 10 Q-ped alt LE extension with TrA x10 Lateral band walks with RTB above knees x 3 laps at rail  Manual Therapy:   STM and TPR to bilat lumbar paraspinals and QL in prone   02/12/24 Manual Therapy:   STM and TPR to bilat sided lumbar paraspinals, and R QL in prone  Supine SLR with contralateral UE extenion with TRA 2x10 each Supine hip extension arc 2x10 Attempted q-ped birddogs Q-ped alt UE flexion with TrA x 10 Q-ped alt hip extension with TrA x 8 Standing paloff press with TrA with RTB 2x10 each Lateral band walks with RTB above knees x 3 laps at rail Mini squats with TrA with UE  support 2x10   02/04/24 Reviewed current function, home management strategies, pain levels, and response to prior treatment.  PT assessed lumbar AROM and LE strength.  See above Pt completed modified Oswestry. See above  Lateral band walks with RTB above knees x 3 laps at rail Mini squats with TrA with UE support at rail x 10 reps Standing paloff press with TrA with RTB x 10 each Supine alt hip extension arc with TrA x10 reps   01/30/24 Supine SLR with contralateral UE extenion with TRA x10 each Supine alt hip extension arc with TrA x10, 6 reps Supine bridge with clams with RTB with TrA x 10 and with GTB x10 Sidestepping with TrA x 1 lap and with RTB above knees x 2 laps Standing  alternating shoulder extension with TrA with RTB 2x10 Standing paloff press with TrA with RTB x 10 each  Manual Therapy:   STM and TPR to bilat sided lumbar paraspinals, and R QL in prone  PT educated pt using her access code to begin using the Medbridge[employer] app    01/28/24 Reviewed pt presentation, HEP compliance, and pain level.  Supine alt UE/LE with TrA x12  Supine SLR with TrA x10 each Supine SLR with contralateral UE extenion with TRA x10 each Standing paloff press with TrA with RTB x 10 each Supine bridge with clams with RTB with TrA  x10 Standing alternating shoulder extension with TrA with RTB 2x10  Manual Therapy:   STM and TPR to R sided lumbar paraspinals, and R QL in prone    PATIENT EDUCATION:  Education details:  exercise form, HEP, POC, scope of PT, relevant anatomy, and rationale of interventions.  PT answered pt's questions. Person educated: Patient Education method: Explanation, Demonstration, verbal cues Education comprehension: verbalized understanding, returned demonstration, verbal cues required   HOME EXERCISE PROGRAM: Access Code: [email or web address] URL: [email or web address] Date: 12/12/2023 Prepared by: Prentice Zaunegger  Exercises - Supine Bridge  - 1 x  daily - 7 x weekly - 2 sets - 10 reps - Supine Lower Trunk Rotation  - 2 x daily - 7 x weekly - 2 sets - 10 reps - Supine Transversus Abdominis Bracing - Hands on Stomach  - 2 x daily - 7 x weekly - 2 sets - 10 reps - 5 seconds hold - Supine March  - 1 x daily - 7 x weekly - 2 sets - 10 reps - Supine Core Control with Leg Extension  - 1 x daily - 7 x weekly - 2 sets - 10 reps - Hooklying Clamshell with Resistance  - 1 x daily - 5 x weekly - 2 sets - 10 reps - Standing Shoulder Row with Anchored Resistance  - 1 x daily - 4-5 x weekly - 2 sets - 10 reps - Modified Thomas Stretch  - 1 x daily - 7 x weekly - 2-3 reps - 20 seconds hold  ASSESSMENT:  CLINICAL IMPRESSION: Pt is improving overall and continues to have pain with sitting.  PT is progressing core exercises.  PT progressed supine alt UE/LE to dead bugs with feet unsupported.  Pt was able to perform dead bugs though was fatigued stopping each set before she reached 10 reps.  She reports fatigue in wrists with quadruped exercises.  PT performed STM and TPR to improve soft tissue tightness and mobility and to reduce pain and myofascial restrictions.  Pt responded well to treatment having no increased pain and no c/o's after treatment.  She should benefit from cont skilled PT services to address ongoing goals and impairments and to assist in restoring pt's PLOF.        OBJECTIVE IMPAIRMENTS: decreased activity tolerance, decreased balance, decreased endurance, decreased mobility, difficulty walking, decreased ROM, decreased strength, hypomobility, increased muscle spasms, impaired flexibility, improper body mechanics, postural dysfunction, and pain  ACTIVITY LIMITATIONS: carrying, lifting, bending, standing, squatting, stairs, transfers, bed mobility, reach over head, locomotion level, and caring for others  PARTICIPATION LIMITATIONS:  meal prep, cleaning, laundry, shopping, community activity, and yard work  PERSONAL FACTORS: Profession and  Time since onset of injury/illness/exacerbation are also affecting patient's functional outcome.   REHAB POTENTIAL: Good  CLINICAL DECISION MAKING: Evolving/moderate complexity  EVALUATION COMPLEXITY: Moderate   GOALS: Goals reviewed with patient? Yes  SHORT TERM GOALS:  Target date: 01/09/2024    Patient will be independent with HEP in order to improve functional outcomes. Baseline:  Goal status: GOAL MET  11/11  2.  Patient will report at least 25% improvement in symptoms for improved quality of life. Baseline:  Goal status: GOAL MET  11/11    LONG TERM GOALS: Target date: 03/10/2024    Patient will report at least 75% improvement in symptoms for improved quality of life. Baseline:  Goal status:  PROGRESSING   2.  Patient will improve Modified Oswestry score by at least 10 points in order to indicate improved tolerance to activity. Baseline: 12/50 Goal status: ONGOING  3.  Patient will demonstrate full lumbar ROM in all restricted planes for improved ability to move trunk while completing chores/work duties. Baseline:  Goal status: NOT MET  4.  Patient will be able to return to all activities unrestricted for improved ability to perform work functions and  exercise.  Baseline:  Goal status: ONGOING  5.  Patient will demonstrate grade of 5/5 MMT grade in all tested musculature as evidence of improved strength to assist with stair ambulation and gait.   Baseline:  Goal status: NOT MET   PLAN:  PT FREQUENCY: 1-2x/week  PT DURATION:  5 weeks   PLANNED INTERVENTIONS: [id number]- PT Re-evaluation, 97110-Therapeutic exercises, 97530- Therapeutic activity, [id number]- Neuromuscular re-education, 97535- Self Care, [id number]- Manual therapy, [id number]- Gait training, [phone]- Orthotic Fit/training, [phone]- Canalith repositioning, [id number]- Aquatic Therapy, [phone]- Splinting, [phone]- Wound care (first 20 sq cm), 97598- Wound care (each additional 20 sq cm)Patient/Family education, Balance  training, Stair training, Taping, Dry Needling, Joint mobilization, Joint manipulation, Spinal manipulation, Spinal mobilization, Scar mobilization, and DME instructions.  PLAN FOR NEXT SESSION: f/u with HEP, hip and core strength, postural strength, spinal mobility, manual for pain/mobility   Leigh Minerva III PT, DPT 02/19/24 10:05 PM

## 2024-02-24 ENCOUNTER — Encounter (HOSPITAL_BASED_OUTPATIENT_CLINIC_OR_DEPARTMENT_OTHER): Payer: Self-pay | Admitting: Physical Therapy

## 2024-02-24 ENCOUNTER — Ambulatory Visit (HOSPITAL_BASED_OUTPATIENT_CLINIC_OR_DEPARTMENT_OTHER): Payer: Worker's Compensation | Attending: Physician Assistant | Admitting: Physical Therapy

## 2024-02-24 DIAGNOSIS — M5459 Other low back pain: Secondary | ICD-10-CM | POA: Insufficient documentation

## 2024-02-24 DIAGNOSIS — R29898 Other symptoms and signs involving the musculoskeletal system: Secondary | ICD-10-CM | POA: Diagnosis present

## 2024-02-24 DIAGNOSIS — M6281 Muscle weakness (generalized): Secondary | ICD-10-CM | POA: Diagnosis present

## 2024-02-24 NOTE — Therapy (Signed)
 OUTPATIENT PHYSICAL THERAPY THORACOLUMBAR TREATMENT    Patient Name: Nicole Davila MRN: [id number] DOB:09/17/1960, 63 y.o., female Today's Date: 02/25/2024  END OF SESSION:  PT End of Session - 02/24/24 1505     Visit Number 13    Number of Visits 20    Date for Recertification  03/10/24    Authorization Type Worker's Comp    PT Start Time 1504   Pt arrived late to treatment.   PT Stop Time 1552    PT Time Calculation (min) 48 min    Activity Tolerance Patient tolerated treatment well    Behavior During Therapy [facility] for tasks assessed/performed                History reviewed. No pertinent past medical history. History reviewed. No pertinent surgical history. Patient Active Problem List   Diagnosis Date Noted   Pain in joint, ankle and foot 07/05/2016   Metatarsalgia of right foot 07/05/2016   Abnormality of gait 07/05/2016    PCP: Aisha Harvey, MD   REFERRING PROVIDER: Candance Jeoffrey SAILOR, PA-C  REFERRING DIAG: M54.50 (ICD-10-CM) - Low back pain, unspecified  Rationale for Evaluation and Treatment: Rehabilitation  THERAPY DIAG:  Other low back pain  Muscle weakness (generalized)  Other symptoms and signs involving the musculoskeletal system  ONSET DATE: June 2025  SUBJECTIVE:                                                                                                                                                                                           SUBJECTIVE STATEMENT: Pt denies any pain currently.  She denies any adverse effects after prior treatment.  Pt had pain on L side of lumbar when twisting to reach the back seat in the car.  Pt states the pain resolved in 10-15 mins.  Pt states she has not any problems today.     Pt is a member at [employer].  PERTINENT HISTORY:  CAD, GERD Restriction of lifting no > 10 lbs per PA note  PAIN:  Are you having pain? Yes: NPRS scale: 0/10 current, 5-6/10 worst, 0/10 best Pain location: central lumbar,  L > R sided lumbar  Pain description: sharp Aggravating factors: standing 10-15 mins, sitting.  Worst pain is with twisting Relieving factors: heat, walking  PRECAUTIONS: None  WEIGHT BEARING RESTRICTIONS: No  FALLS:  Has patient fallen in last 6 months? No  OCCUPATION: librarian  PLOF: Independent  PATIENT GOALS: Wants to know what she can do, Back to feel better   OBJECTIVE: (objective measures from initial evaluation unless otherwise dated)  PATIENT SURVEYS:  Modified Oswestry:  MODIFIED OSWESTRY  DISABILITY SCALE   Date: 12/12/23 Score 11/11  Pain intensity 1 = The pain is bad, but I can manage without having to take (1) I can stand as long as I want but, it increases my pain. pain medication. 0   2. Personal care (washing, dressing, etc.) 0 =  I can take care of myself normally without causing increased pain. 1  3. Lifting 4 = I can lift only very light weights 4  4. Walking 1 = Pain prevents me from walking more than 1 mile. 0  5. Sitting 3 =  Pain prevents me from sitting more than  hour. 2  6. Standing 1 =  I can stand as long as I want but, it increases my pain. 2  7. Sleeping 0 = Pain does not prevent me from sleeping well. 0  8. Social Life 0 = My social life is normal and does not increase my pain. 0  9. Traveling 1 =  I can travel anywhere, but it increases my pain. 1  10. Employment/ Homemaking 1 = My normal homemaking/job activities increase my pain, but I can still perform all that is required of me 1  Total 12/50 11/50 = 22%   Interpretation of scores: Score Category Description  0-20% Minimal Disability The patient can cope with most living activities. Usually no treatment is indicated apart from advice on lifting, sitting and exercise  21-40% Moderate Disability The patient experiences more pain and difficulty with sitting, lifting and standing. Travel and social life are more difficult and they may be disabled from work. Personal care, sexual activity and  sleeping are not grossly affected, and the patient can usually be managed by conservative means  41-60% Severe Disability Pain remains the main problem in this group, but activities of daily living are affected. These patients require a detailed investigation  61-80% Crippled Back pain impinges on all aspects of the patient's life. Positive intervention is required  81-100% Bed-bound  These patients are either bed-bound or exaggerating their symptoms  Bluford FORBES [name], et al. Surgery versus conservative management of stable thoracolumbar fracture: the PRESTO feasibility RCT. [place] ([place]): [employer]; 2021 Nov. [employer] Technology Assessment, No. 25.62.) Appendix 3, Oswestry Disability Index category descriptors. Available from: Findjewelers.cz  Minimally Clinically Important Difference (MCID) = 12.8%  SCREENING FOR RED FLAGS: Bowel or bladder incontinence: No Spinal tumors: No Cauda equina syndrome: No Compression fracture: No Abdominal aneurysm: No  COGNITION: Overall cognitive status: Within functional limits for tasks assessed     SENSATION: WFL    POSTURE: decreased lumbar lordosis  PALPATION: Hypomobile grossly in thoracic and lumbar spine, tenderness mid thoracic and upper lumbar UPA; TTP bilateral but R>L paraspinals and QL, R glute tenderness; notes increased tenderness at R L5 paraspinals following testing  LUMBAR ROM: stretch with rotation bilateral  AROM eval 11/11  Flexion 0% limited WNL  Extension 0% limited WFL  Right lateral flexion 0% limited 10% limited   Left lateral flexion 25% limited* 25% limited  Right rotation 0% limited 25% limited  Left rotation 0% limited WFL   (Blank rows = not tested) * = pain/symptoms  LOWER EXTREMITY ROM:   WFL for tasks assessed  Active  Right eval Left eval  Hip flexion    Hip extension    Hip abduction    Hip adduction    Hip internal rotation    Hip external  rotation    Knee flexion    Knee extension  Ankle dorsiflexion    Ankle plantarflexion    Ankle inversion    Ankle eversion     (Blank rows = not tested) * = pain/symptoms  LOWER EXTREMITY MMT:    MMT Right eval Left eval Right 11/11 Left 11/11  Hip flexion 4+ 4+ 4/5 4+/5  Hip extension 4- 4- 4-/5 4+/5  Hip abduction 4- * 4- 3+/5 4-/5  Hip adduction      Hip internal rotation      Hip external rotation      Knee flexion 5 5 5/5 5/5  Knee extension 5 5 4/5 4/5  Ankle dorsiflexion 5 5    Ankle plantarflexion      Ankle inversion      Ankle eversion       (Blank rows = not tested) * = pain/symptoms    FUNCTIONAL TESTS:  5 times sit to stand: 9.57 seconds without UE support, mild L >R  Squat/lifting mechanics: fair mechanics  GAIT: Distance walked: 100 feet Assistive device utilized: None Level of assistance: Complete Independence Comments: Mild R compensated trendelenberg  TODAY'S TREATMENT:                                                                                                                              DATE:  02/24/24 Pt ambulated 3 laps in the clinic to warm up. Supine dead bug with feet unsupported x6,5 reps Supine SLR with contralateral UE extenion with TrA x10 each Q-ped alt UE flexion with TrA x 10 Q-ped alt LE extension with TrA x10 Standing paloff press with TrA with RTB 2x10 each Chops with RTB with TrA x 10 Lateral band walks with RTB with TrA 3x10  Manual Therapy:   STM and TPR to bilat lumbar paraspinals and QL in prone    02/18/24 Reviewed pt presentation, pain level, response to prior treatment, and HEP compliance.  Pt ambulated 3 laps in the clinic to warm up. Supine SLR with contralateral UE extenion with TrA 2x10 each Supine dead bug with feet unsupported x8,6 reps Q-ped alt UE flexion with TrA x 10 Q-ped alt LE extension with TrA x10 Lateral band walks with RTB above knees x 3 laps at rail  Manual Therapy:   STM and TPR  to bilat lumbar paraspinals and QL in prone   02/12/24 Manual Therapy:   STM and TPR to bilat sided lumbar paraspinals, and R QL in prone  Supine SLR with contralateral UE extenion with TRA 2x10 each Supine hip extension arc 2x10 Attempted q-ped birddogs Q-ped alt UE flexion with TrA x 10 Q-ped alt hip extension with TrA x 8 Standing paloff press with TrA with RTB 2x10 each Lateral band walks with RTB above knees x 3 laps at rail Mini squats with TrA with UE support 2x10   02/04/24 Reviewed current function, home management strategies, pain levels, and response to prior treatment.  PT assessed lumbar AROM and LE strength.  See above  Pt completed modified Oswestry. See above  Lateral band walks with RTB above knees x 3 laps at rail Mini squats with TrA with UE support at rail x 10 reps Standing paloff press with TrA with RTB x 10 each Supine alt hip extension arc with TrA x10 reps   01/30/24 Supine SLR with contralateral UE extenion with TRA x10 each Supine alt hip extension arc with TrA x10, 6 reps Supine bridge with clams with RTB with TrA x 10 and with GTB x10 Sidestepping with TrA x 1 lap and with RTB above knees x 2 laps Standing alternating shoulder extension with TrA with RTB 2x10 Standing paloff press with TrA with RTB x 10 each  Manual Therapy:   STM and TPR to bilat sided lumbar paraspinals, and R QL in prone  PT educated pt using her access code to begin using the Medbridge[employer] app    01/28/24 Reviewed pt presentation, HEP compliance, and pain level.  Supine alt UE/LE with TrA x12  Supine SLR with TrA x10 each Supine SLR with contralateral UE extenion with TRA x10 each Standing paloff press with TrA with RTB x 10 each Supine bridge with clams with RTB with TrA  x10 Standing alternating shoulder extension with TrA with RTB 2x10  Manual Therapy:   STM and TPR to R sided lumbar paraspinals, and R QL in prone    PATIENT EDUCATION:  Education details:   exercise form, HEP, POC, scope of PT, relevant anatomy, and rationale of interventions.  PT answered pt's questions. Person educated: Patient Education method: Explanation, Demonstration, verbal cues Education comprehension: verbalized understanding, returned demonstration, verbal cues required   HOME EXERCISE PROGRAM: Access Code: [email or web address] URL: [email or web address] Date: 12/12/2023 Prepared by: Prentice Zaunegger  Exercises - Supine Bridge  - 1 x daily - 7 x weekly - 2 sets - 10 reps - Supine Lower Trunk Rotation  - 2 x daily - 7 x weekly - 2 sets - 10 reps - Supine Transversus Abdominis Bracing - Hands on Stomach  - 2 x daily - 7 x weekly - 2 sets - 10 reps - 5 seconds hold - Supine March  - 1 x daily - 7 x weekly - 2 sets - 10 reps - Supine Core Control with Leg Extension  - 1 x daily - 7 x weekly - 2 sets - 10 reps - Hooklying Clamshell with Resistance  - 1 x daily - 5 x weekly - 2 sets - 10 reps - Standing Shoulder Row with Anchored Resistance  - 1 x daily - 4-5 x weekly - 2 sets - 10 reps - Modified Thomas Stretch  - 1 x daily - 7 x weekly - 2-3 reps - 20 seconds hold  ASSESSMENT:  CLINICAL IMPRESSION: Pt is progressing well with pain, function, and core strength.  PT is progressing core exercises.  She is challenged with dead bugs with feet unsupported being unable to perform a set of 10 reps, but doesn't have pain.  PT added chops with RTB and pt performed without c/o's.  She performed exercises well.  PT performed STM and TPR to improve soft tissue tightness and mobility and to reduce pain and myofascial restrictions.  Pt responded well to treatment having no pain and no c/o's after treatment.  She should benefit from cont skilled PT services to address ongoing goals and impairments and to assist in restoring pt's PLOF.        OBJECTIVE IMPAIRMENTS: decreased activity tolerance, decreased balance, decreased endurance, decreased  mobility, difficulty walking, decreased  ROM, decreased strength, hypomobility, increased muscle spasms, impaired flexibility, improper body mechanics, postural dysfunction, and pain  ACTIVITY LIMITATIONS: carrying, lifting, bending, standing, squatting, stairs, transfers, bed mobility, reach over head, locomotion level, and caring for others  PARTICIPATION LIMITATIONS:  meal prep, cleaning, laundry, shopping, community activity, and yard work  PERSONAL FACTORS: Profession and Time since onset of injury/illness/exacerbation are also affecting patient's functional outcome.   REHAB POTENTIAL: Good  CLINICAL DECISION MAKING: Evolving/moderate complexity  EVALUATION COMPLEXITY: Moderate   GOALS: Goals reviewed with patient? Yes  SHORT TERM GOALS: Target date: 01/09/2024    Patient will be independent with HEP in order to improve functional outcomes. Baseline:  Goal status: GOAL MET  11/11  2.  Patient will report at least 25% improvement in symptoms for improved quality of life. Baseline:  Goal status: GOAL MET  11/11    LONG TERM GOALS: Target date: 03/10/2024    Patient will report at least 75% improvement in symptoms for improved quality of life. Baseline:  Goal status:  PROGRESSING   2.  Patient will improve Modified Oswestry score by at least 10 points in order to indicate improved tolerance to activity. Baseline: 12/50 Goal status: ONGOING  3.  Patient will demonstrate full lumbar ROM in all restricted planes for improved ability to move trunk while completing chores/work duties. Baseline:  Goal status: NOT MET  4.  Patient will be able to return to all activities unrestricted for improved ability to perform work functions and  exercise.  Baseline:  Goal status: ONGOING  5.  Patient will demonstrate grade of 5/5 MMT grade in all tested musculature as evidence of improved strength to assist with stair ambulation and gait.   Baseline:  Goal status: NOT MET   PLAN:  PT FREQUENCY: 1-2x/week  PT  DURATION:  5 weeks   PLANNED INTERVENTIONS: 97164- PT Re-evaluation, 97110-Therapeutic exercises, 97530- Therapeutic activity, [id number]- Neuromuscular re-education, 97535- Self Care, [id number]- Manual therapy, [id number]- Gait training, [phone]- Orthotic Fit/training, [phone]- Canalith repositioning, [id number]- Aquatic Therapy, [phone]- Splinting, [phone]- Wound care (first 20 sq cm), 97598- Wound care (each additional 20 sq cm)Patient/Family education, Balance training, Stair training, Taping, Dry Needling, Joint mobilization, Joint manipulation, Spinal manipulation, Spinal mobilization, Scar mobilization, and DME instructions.  PLAN FOR NEXT SESSION: f/u with HEP, hip and core strength, postural strength, spinal mobility, manual for pain/mobility   Leigh Minerva III PT, DPT 02/25/24 3:26 PM

## 2024-03-02 ENCOUNTER — Encounter (HOSPITAL_BASED_OUTPATIENT_CLINIC_OR_DEPARTMENT_OTHER): Payer: Self-pay | Admitting: Physical Therapy

## 2024-03-02 ENCOUNTER — Ambulatory Visit (HOSPITAL_BASED_OUTPATIENT_CLINIC_OR_DEPARTMENT_OTHER): Payer: Worker's Compensation | Admitting: Physical Therapy

## 2024-03-02 DIAGNOSIS — R29898 Other symptoms and signs involving the musculoskeletal system: Secondary | ICD-10-CM

## 2024-03-02 DIAGNOSIS — M5459 Other low back pain: Secondary | ICD-10-CM | POA: Diagnosis not present

## 2024-03-02 DIAGNOSIS — M6281 Muscle weakness (generalized): Secondary | ICD-10-CM

## 2024-03-02 NOTE — Therapy (Signed)
 OUTPATIENT PHYSICAL THERAPY THORACOLUMBAR TREATMENT    Patient Name: Nicole Davila MRN: [id number] DOB:06-08-1960, 63 y.o., female Today's Date: 03/03/2024  END OF SESSION:  PT End of Session - 03/02/24 1042     Visit Number 14    Number of Visits 20    Date for Recertification  03/10/24    Authorization Type Worker's Comp    PT Start Time 1027    PT Stop Time 1111    PT Time Calculation (min) 44 min    Activity Tolerance Patient tolerated treatment well    Behavior During Therapy WFL for tasks assessed/performed                 History reviewed. No pertinent past medical history. History reviewed. No pertinent surgical history. Patient Active Problem List   Diagnosis Date Noted   Pain in joint, ankle and foot 07/05/2016   Metatarsalgia of right foot 07/05/2016   Abnormality of gait 07/05/2016    PCP: Aisha Harvey, MD   REFERRING PROVIDER: Candance Jeoffrey SAILOR, PA-C  REFERRING DIAG: M54.50 (ICD-10-CM) - Low back pain, unspecified  Rationale for Evaluation and Treatment: Rehabilitation  THERAPY DIAG:  Other low back pain  Muscle weakness (generalized)  Other symptoms and signs involving the musculoskeletal system  ONSET DATE: June 2025  SUBJECTIVE:                                                                                                                                                                                           SUBJECTIVE STATEMENT: Pt has been performing her HEP and walking.  Pt was able to perform 10 reps on the dead bug exercise at home.  Pt is not having to take Tylenol as often.  She still has pain with twisting.  Pt had pain this AM when lying in bed and reaching back like the open door stretch.  She denies any adverse effects after prior treatment.    Pt is a member at [employer].  PERTINENT HISTORY:  CAD, GERD Restriction of lifting no > 10 lbs per PA note  PAIN:  Are you having pain? Yes: NPRS scale: 2/10 current, 5-6/10  worst, 0/10 best Pain location: R sided lumbar  Pain description: sharp Aggravating factors: standing 10-15 mins, sitting.  Worst pain is with twisting Relieving factors: heat, walking  PRECAUTIONS: None  WEIGHT BEARING RESTRICTIONS: No  FALLS:  Has patient fallen in last 6 months? No  OCCUPATION: librarian  PLOF: Independent  PATIENT GOALS: Wants to know what she can do, Back to feel better   OBJECTIVE: (objective measures from initial evaluation unless otherwise dated)  PATIENT SURVEYS:  Modified Oswestry:  MODIFIED OSWESTRY DISABILITY SCALE   Date: 12/12/23 Score 11/11  Pain intensity 1 = The pain is bad, but I can manage without having to take (1) I can stand as long as I want but, it increases my pain. pain medication. 0   2. Personal care (washing, dressing, etc.) 0 =  I can take care of myself normally without causing increased pain. 1  3. Lifting 4 = I can lift only very light weights 4  4. Walking 1 = Pain prevents me from walking more than 1 mile. 0  5. Sitting 3 =  Pain prevents me from sitting more than  hour. 2  6. Standing 1 =  I can stand as long as I want but, it increases my pain. 2  7. Sleeping 0 = Pain does not prevent me from sleeping well. 0  8. Social Life 0 = My social life is normal and does not increase my pain. 0  9. Traveling 1 =  I can travel anywhere, but it increases my pain. 1  10. Employment/ Homemaking 1 = My normal homemaking/job activities increase my pain, but I can still perform all that is required of me 1  Total 12/50 11/50 = 22%   Interpretation of scores: Score Category Description  0-20% Minimal Disability The patient can cope with most living activities. Usually no treatment is indicated apart from advice on lifting, sitting and exercise  21-40% Moderate Disability The patient experiences more pain and difficulty with sitting, lifting and standing. Travel and social life are more difficult and they may be disabled from work.  Personal care, sexual activity and sleeping are not grossly affected, and the patient can usually be managed by conservative means  41-60% Severe Disability Pain remains the main problem in this group, but activities of daily living are affected. These patients require a detailed investigation  61-80% Crippled Back pain impinges on all aspects of the patient's life. Positive intervention is required  81-100% Bed-bound  These patients are either bed-bound or exaggerating their symptoms  Bluford FORBES [name], et al. Surgery versus conservative management of stable thoracolumbar fracture: the PRESTO feasibility RCT. [place] ([place]): [employer]; 2021 Nov. [facility] Technology Assessment, No. 25.62.) Appendix 3, Oswestry Disability Index category descriptors. Available from: [email or web address].cz  Minimally Clinically Important Difference (MCID) = 12.8%  SCREENING FOR RED FLAGS: Bowel or bladder incontinence: No Spinal tumors: No Cauda equina syndrome: No Compression fracture: No Abdominal aneurysm: No  COGNITION: Overall cognitive status: Within functional limits for tasks assessed     SENSATION: WFL    POSTURE: decreased lumbar lordosis  PALPATION: Hypomobile grossly in thoracic and lumbar spine, tenderness mid thoracic and upper lumbar UPA; TTP bilateral but R>L paraspinals and QL, R glute tenderness; notes increased tenderness at R L5 paraspinals following testing  LUMBAR ROM: stretch with rotation bilateral  AROM eval 11/11  Flexion 0% limited WNL  Extension 0% limited WFL  Right lateral flexion 0% limited 10% limited   Left lateral flexion 25% limited* 25% limited  Right rotation 0% limited 25% limited  Left rotation 0% limited WFL   (Blank rows = not tested) * = pain/symptoms  LOWER EXTREMITY ROM:   WFL for tasks assessed  Active  Right eval Left eval  Hip flexion    Hip extension    Hip abduction    Hip adduction    Hip  internal rotation    Hip external rotation    Knee flexion  Knee extension    Ankle dorsiflexion    Ankle plantarflexion    Ankle inversion    Ankle eversion     (Blank rows = not tested) * = pain/symptoms  LOWER EXTREMITY MMT:    MMT Right eval Left eval Right 11/11 Left 11/11  Hip flexion 4+ 4+ 4/5 4+/5  Hip extension 4- 4- 4-/5 4+/5  Hip abduction 4- * 4- 3+/5 4-/5  Hip adduction      Hip internal rotation      Hip external rotation      Knee flexion 5 5 5/5 5/5  Knee extension 5 5 4/5 4/5  Ankle dorsiflexion 5 5    Ankle plantarflexion      Ankle inversion      Ankle eversion       (Blank rows = not tested) * = pain/symptoms    FUNCTIONAL TESTS:  5 times sit to stand: 9.57 seconds without UE support, mild L >R  Squat/lifting mechanics: fair mechanics  GAIT: Distance walked: 100 feet Assistive device utilized: None Level of assistance: Complete Independence Comments: Mild R compensated trendelenberg  TODAY'S TREATMENT:                                                                                                                              DATE:  03/02/24 Pt ambulated 3 laps in the clinic to warm up. Supine dead bug with feet unsupported 2x10 Supine lumbar rotation 2x10  Assessed sx's with open book movement and the movement she did earlier this AM  S/L grade II-III neutral gapping joint mobs  x 2 repts in R S/L'ing  and x 3 reps in L S/Ling Re-assessed after Neutral gapping joint mobs  Supine piriformis stretch 2x30 sec bilat STM to bilat lumbar paraspinals in prone   02/24/24 Pt ambulated 3 laps in the clinic to warm up. Supine dead bug with feet unsupported x6,5 reps Supine SLR with contralateral UE extenion with TrA x10 each Q-ped alt UE flexion with TrA x 10 Q-ped alt LE extension with TrA x10 Standing paloff press with TrA with RTB 2x10 each Chops with RTB with TrA x 10 Lateral band walks with RTB with TrA 3x10  Manual Therapy:   STM and  TPR to bilat lumbar paraspinals and QL in prone    02/18/24 Reviewed pt presentation, pain level, response to prior treatment, and HEP compliance.  Pt ambulated 3 laps in the clinic to warm up. Supine SLR with contralateral UE extenion with TrA 2x10 each Supine dead bug with feet unsupported x8,6 reps Q-ped alt UE flexion with TrA x 10 Q-ped alt LE extension with TrA x10 Lateral band walks with RTB above knees x 3 laps at rail  Manual Therapy:   STM and TPR to bilat lumbar paraspinals and QL in prone   02/12/24 Manual Therapy:   STM and TPR to bilat sided lumbar paraspinals, and R QL in prone  Supine SLR with  contralateral UE extenion with TRA 2x10 each Supine hip extension arc 2x10 Attempted q-ped birddogs Q-ped alt UE flexion with TrA x 10 Q-ped alt hip extension with TrA x 8 Standing paloff press with TrA with RTB 2x10 each Lateral band walks with RTB above knees x 3 laps at rail Mini squats with TrA with UE support 2x10   02/04/24 Reviewed current function, home management strategies, pain levels, and response to prior treatment.  PT assessed lumbar AROM and LE strength.  See above Pt completed modified Oswestry. See above  Lateral band walks with RTB above knees x 3 laps at rail Mini squats with TrA with UE support at rail x 10 reps Standing paloff press with TrA with RTB x 10 each Supine alt hip extension arc with TrA x10 reps   01/30/24 Supine SLR with contralateral UE extenion with TRA x10 each Supine alt hip extension arc with TrA x10, 6 reps Supine bridge with clams with RTB with TrA x 10 and with GTB x10 Sidestepping with TrA x 1 lap and with RTB above knees x 2 laps Standing alternating shoulder extension with TrA with RTB 2x10 Standing paloff press with TrA with RTB x 10 each  Manual Therapy:   STM and TPR to bilat sided lumbar paraspinals, and R QL in prone  PT educated pt using her access code to begin using the Medbridge[employer] app     01/28/24 Reviewed pt presentation, HEP compliance, and pain level.  Supine alt UE/LE with TrA x12  Supine SLR with TrA x10 each Supine SLR with contralateral UE extenion with TRA x10 each Standing paloff press with TrA with RTB x 10 each Supine bridge with clams with RTB with TrA  x10 Standing alternating shoulder extension with TrA with RTB 2x10  Manual Therapy:   STM and TPR to R sided lumbar paraspinals, and R QL in prone    PATIENT EDUCATION:  Education details:  exercise form, HEP, POC, scope of PT, relevant anatomy, and rationale of interventions.  PT answered pt's questions. Person educated: Patient Education method: Explanation, Demonstration, verbal cues Education comprehension: verbalized understanding, returned demonstration, verbal cues required   HOME EXERCISE PROGRAM: Access Code: [email or web address] URL: [email or web address] Date: 12/12/2023 Prepared by: Prentice Zaunegger  Exercises - Supine Bridge  - 1 x daily - 7 x weekly - 2 sets - 10 reps - Supine Lower Trunk Rotation  - 2 x daily - 7 x weekly - 2 sets - 10 reps - Supine Transversus Abdominis Bracing - Hands on Stomach  - 2 x daily - 7 x weekly - 2 sets - 10 reps - 5 seconds hold - Supine March  - 1 x daily - 7 x weekly - 2 sets - 10 reps - Supine Core Control with Leg Extension  - 1 x daily - 7 x weekly - 2 sets - 10 reps - Hooklying Clamshell with Resistance  - 1 x daily - 5 x weekly - 2 sets - 10 reps - Standing Shoulder Row with Anchored Resistance  - 1 x daily - 4-5 x weekly - 2 sets - 10 reps - Modified Thomas Stretch  - 1 x daily - 7 x weekly - 2-3 reps - 20 seconds hold  ASSESSMENT:  CLINICAL IMPRESSION: Pt reports she is needing Tylenol less often.  Pt demonstrates improved core strength as evidenced by completing 2 sets of 10 reps with dead bugs with feet unsupported.  Pt continues to have pain with twisting.  PT performed S/L  neutral gapping joint mobs to determine if that would improve  pt's pain and sx's with twisting.  Pt had no improvement in pain/sx's with twisting after S/L neutral gapping joint mobs and states she felt a little worse.  She actually felt tighter in her lumbar and thoracic paraspinals after S/L'ing neutral gapping joint mobs.  PT answered pt's questions including HEP.  Pt responded well to [facility] stating she wasn't as tight after STM.  She reports increased pain from 2/10 before treatment to 3-4/10 after treatment though had improved tightness.   OBJECTIVE IMPAIRMENTS: decreased activity tolerance, decreased balance, decreased endurance, decreased mobility, difficulty walking, decreased ROM, decreased strength, hypomobility, increased muscle spasms, impaired flexibility, improper body mechanics, postural dysfunction, and pain  ACTIVITY LIMITATIONS: carrying, lifting, bending, standing, squatting, stairs, transfers, bed mobility, reach over head, locomotion level, and caring for others  PARTICIPATION LIMITATIONS:  meal prep, cleaning, laundry, shopping, community activity, and yard work  PERSONAL FACTORS: Profession and Time since onset of injury/illness/exacerbation are also affecting patient's functional outcome.   REHAB POTENTIAL: Good  CLINICAL DECISION MAKING: Evolving/moderate complexity  EVALUATION COMPLEXITY: Moderate   GOALS: Goals reviewed with patient? Yes  SHORT TERM GOALS: Target date: 01/09/2024    Patient will be independent with HEP in order to improve functional outcomes. Baseline:  Goal status: GOAL MET  11/11  2.  Patient will report at least 25% improvement in symptoms for improved quality of life. Baseline:  Goal status: GOAL MET  11/11    LONG TERM GOALS: Target date: 03/10/2024    Patient will report at least 75% improvement in symptoms for improved quality of life. Baseline:  Goal status:  PROGRESSING   2.  Patient will improve Modified Oswestry score by at least 10 points in order to indicate improved tolerance to  activity. Baseline: 12/50 Goal status: ONGOING  3.  Patient will demonstrate full lumbar ROM in all restricted planes for improved ability to move trunk while completing chores/work duties. Baseline:  Goal status: NOT MET  4.  Patient will be able to return to all activities unrestricted for improved ability to perform work functions and  exercise.  Baseline:  Goal status: ONGOING  5.  Patient will demonstrate grade of 5/5 MMT grade in all tested musculature as evidence of improved strength to assist with stair ambulation and gait.   Baseline:  Goal status: NOT MET   PLAN:  PT FREQUENCY: 1-2x/week  PT DURATION:  5 weeks   PLANNED INTERVENTIONS: 97164- PT Re-evaluation, 97110-Therapeutic exercises, 97530- Therapeutic activity, [id number]- Neuromuscular re-education, 97535- Self Care, [id number]- Manual therapy, [id number]- Gait training, [phone]- Orthotic Fit/training, [phone]- Canalith repositioning, [id number]- Aquatic Therapy, [phone]- Splinting, [phone]- Wound care (first 20 sq cm), 97598- Wound care (each additional 20 sq cm)Patient/Family education, Balance training, Stair training, Taping, Dry Needling, Joint mobilization, Joint manipulation, Spinal manipulation, Spinal mobilization, Scar mobilization, and DME instructions.  PLAN FOR NEXT SESSION: f/u with HEP, hip and core strength, postural strength, spinal mobility, manual for pain/mobility   Leigh Minerva III PT, DPT 03/03/24 1:57 PM

## 2024-03-04 ENCOUNTER — Encounter: Payer: Self-pay | Admitting: Internal Medicine

## 2024-03-10 ENCOUNTER — Encounter (HOSPITAL_BASED_OUTPATIENT_CLINIC_OR_DEPARTMENT_OTHER): Payer: Self-pay | Admitting: Physical Therapy

## 2024-03-10 ENCOUNTER — Ambulatory Visit (HOSPITAL_BASED_OUTPATIENT_CLINIC_OR_DEPARTMENT_OTHER): Payer: Worker's Compensation | Admitting: Physical Therapy

## 2024-03-10 DIAGNOSIS — R29898 Other symptoms and signs involving the musculoskeletal system: Secondary | ICD-10-CM

## 2024-03-10 DIAGNOSIS — M6281 Muscle weakness (generalized): Secondary | ICD-10-CM

## 2024-03-10 DIAGNOSIS — M5459 Other low back pain: Secondary | ICD-10-CM | POA: Diagnosis not present

## 2024-03-10 NOTE — Therapy (Signed)
 OUTPATIENT PHYSICAL THERAPY THORACOLUMBAR TREATMENT    Patient Name: Nicole Davila MRN: [id number] DOB:1960-12-07, 63 y.o., female Today's Date: 03/11/2024  END OF SESSION:  PT End of Session - 03/10/24 1158     Visit Number 15    Number of Visits 20    Date for Recertification  03/10/24    Authorization Type Worker's Comp    PT Start Time 1153    PT Stop Time 1247    PT Time Calculation (min) 54 min    Activity Tolerance Patient tolerated treatment well    Behavior During Therapy WFL for tasks assessed/performed                 History reviewed. No pertinent past medical history. History reviewed. No pertinent surgical history. Patient Active Problem List   Diagnosis Date Noted   Pain in joint, ankle and foot 07/05/2016   Metatarsalgia of right foot 07/05/2016   Abnormality of gait 07/05/2016    PCP: Aisha Harvey, MD   REFERRING PROVIDER: Candance Jeoffrey SAILOR, PA-C  REFERRING DIAG: M54.50 (ICD-10-CM) - Low back pain, unspecified  Rationale for Evaluation and Treatment: Rehabilitation  THERAPY DIAG:  Other low back pain  Muscle weakness (generalized)  Other symptoms and signs involving the musculoskeletal system  ONSET DATE: June 2025  SUBJECTIVE:                                                                                                                                                                                           SUBJECTIVE STATEMENT: Pt met with ortho MD on 03/06/24.  MD gave her an order to continue PT.  Pt continues to have pain with twisting.  Pt noticed if she would turn a little with sitting, she could feel the pain.  Pt has been performing the the open book exercise.  Pt denies any adverse effects after prior treatment.     Pt is a member at [employer].  PERTINENT HISTORY:  CAD, GERD Restriction of lifting no > 10 lbs per PA note  PAIN:  Are you having pain? Yes: NPRS scale: 2-3/10 current, 5-6/10 worst, 0/10 best Pain  location: R sided lumbar  Pain description: sharp Aggravating factors: standing 10-15 mins, sitting.  Worst pain is with twisting Relieving factors: heat, walking  PRECAUTIONS: None  WEIGHT BEARING RESTRICTIONS: No  FALLS:  Has patient fallen in last 6 months? No  OCCUPATION: librarian  PLOF: Independent  PATIENT GOALS: Wants to know what she can do, Back to feel better   OBJECTIVE: (objective measures from initial evaluation unless otherwise dated)  PATIENT SURVEYS:  Modified Oswestry:  MODIFIED OSWESTRY DISABILITY  SCALE   Date: 12/12/23 Score 11/11  Pain intensity 1 = The pain is bad, but I can manage without having to take (1) I can stand as long as I want but, it increases my pain. pain medication. 0   2. Personal care (washing, dressing, etc.) 0 =  I can take care of myself normally without causing increased pain. 1  3. Lifting 4 = I can lift only very light weights 4  4. Walking 1 = Pain prevents me from walking more than 1 mile. 0  5. Sitting 3 =  Pain prevents me from sitting more than  hour. 2  6. Standing 1 =  I can stand as long as I want but, it increases my pain. 2  7. Sleeping 0 = Pain does not prevent me from sleeping well. 0  8. Social Life 0 = My social life is normal and does not increase my pain. 0  9. Traveling 1 =  I can travel anywhere, but it increases my pain. 1  10. Employment/ Homemaking 1 = My normal homemaking/job activities increase my pain, but I can still perform all that is required of me 1  Total 12/50 11/50 = 22%   Interpretation of scores: Score Category Description  0-20% Minimal Disability The patient can cope with most living activities. Usually no treatment is indicated apart from advice on lifting, sitting and exercise  21-40% Moderate Disability The patient experiences more pain and difficulty with sitting, lifting and standing. Travel and social life are more difficult and they may be disabled from work. Personal care, sexual activity  and sleeping are not grossly affected, and the patient can usually be managed by conservative means  41-60% Severe Disability Pain remains the main problem in this group, but activities of daily living are affected. These patients require a detailed investigation  61-80% Crippled Back pain impinges on all aspects of the patients life. Positive intervention is required  81-100% Bed-bound  These patients are either bed-bound or exaggerating their symptoms  Bluford FORBES [name], et al. Surgery versus conservative management of stable thoracolumbar fracture: the PRESTO feasibility RCT. [place] ([place]): [employer]; 2021 Nov. [facility] Technology Assessment, No. 25.62.) Appendix 3, Oswestry Disability Index category descriptors. Available from: Findjewelers.cz  Minimally Clinically Important Difference (MCID) = 12.8%  SCREENING FOR RED FLAGS: Bowel or bladder incontinence: No Spinal tumors: No Cauda equina syndrome: No Compression fracture: No Abdominal aneurysm: No  COGNITION: Overall cognitive status: Within functional limits for tasks assessed     SENSATION: WFL    POSTURE: decreased lumbar lordosis  PALPATION: Hypomobile grossly in thoracic and lumbar spine, tenderness mid thoracic and upper lumbar UPA; TTP bilateral but R>L paraspinals and QL, R glute tenderness; notes increased tenderness at R L5 paraspinals following testing  LUMBAR ROM: stretch with rotation bilateral  AROM eval 11/11  Flexion 0% limited WNL  Extension 0% limited WFL  Right lateral flexion 0% limited 10% limited   Left lateral flexion 25% limited* 25% limited  Right rotation 0% limited 25% limited  Left rotation 0% limited WFL   (Blank rows = not tested) * = pain/symptoms  LOWER EXTREMITY ROM:   WFL for tasks assessed  Active  Right eval Left eval  Hip flexion    Hip extension    Hip abduction    Hip adduction    Hip internal rotation    Hip  external rotation    Knee flexion    Knee extension  Ankle dorsiflexion    Ankle plantarflexion    Ankle inversion    Ankle eversion     (Blank rows = not tested) * = pain/symptoms  LOWER EXTREMITY MMT:    MMT Right eval Left eval Right 11/11 Left 11/11  Hip flexion 4+ 4+ 4/5 4+/5  Hip extension 4- 4- 4-/5 4+/5  Hip abduction 4- * 4- 3+/5 4-/5  Hip adduction      Hip internal rotation      Hip external rotation      Knee flexion 5 5 5/5 5/5  Knee extension 5 5 4/5 4/5  Ankle dorsiflexion 5 5    Ankle plantarflexion      Ankle inversion      Ankle eversion       (Blank rows = not tested) * = pain/symptoms    FUNCTIONAL TESTS:  5 times sit to stand: 9.57 seconds without UE support, mild L >R  Squat/lifting mechanics: fair mechanics  GAIT: Distance walked: 100 feet Assistive device utilized: None Level of assistance: Complete Independence Comments: Mild R compensated trendelenberg  TODAY'S TREATMENT:                                                                                                                              DATE:  03/10/24 Reviewed pt presentation, pain level, and response to prior treatment.   Pt ambulated 3 laps in the clinic to warm up. Supine dead bug with feet unsupported 2x10 S/L open book exercise x 10 each Supine lumbar rotation 2x10 Standing paloff press with TrA with GTB 2x10 each Chops with RTB with TrA 2x 10  STM and TPR to bilat lumbar paraspinals in prone.    03/02/24 Pt ambulated 3 laps in the clinic to warm up. Supine dead bug with feet unsupported 2x10 Supine lumbar rotation 2x10  Assessed sx's with open book movement and the movement she did earlier this AM  S/L grade II-III neutral gapping joint mobs  x 2 repts in R S/L'ing  and x 3 reps in L S/Ling Re-assessed after Neutral gapping joint mobs  Supine piriformis stretch 2x30 sec bilat STM to bilat lumbar paraspinals in prone   02/24/24 Pt ambulated 3 laps in the  clinic to warm up. Supine dead bug with feet unsupported x6,5 reps Supine SLR with contralateral UE extenion with TrA x10 each Q-ped alt UE flexion with TrA x 10 Q-ped alt LE extension with TrA x10 Standing paloff press with TrA with RTB 2x10 each Chops with RTB with TrA x 10 Lateral band walks with RTB with TrA 3x10  Manual Therapy:   STM and TPR to bilat lumbar paraspinals and QL in prone    02/18/24 Reviewed pt presentation, pain level, response to prior treatment, and HEP compliance.  Pt ambulated 3 laps in the clinic to warm up. Supine SLR with contralateral UE extenion with TrA 2x10 each Supine dead bug with feet unsupported x8,6 reps Q-ped alt  UE flexion with TrA x 10 Q-ped alt LE extension with TrA x10 Lateral band walks with RTB above knees x 3 laps at rail  Manual Therapy:   STM and TPR to bilat lumbar paraspinals and QL in prone   02/12/24 Manual Therapy:   STM and TPR to bilat sided lumbar paraspinals, and R QL in prone  Supine SLR with contralateral UE extenion with TRA 2x10 each Supine hip extension arc 2x10 Attempted q-ped birddogs Q-ped alt UE flexion with TrA x 10 Q-ped alt hip extension with TrA x 8 Standing paloff press with TrA with RTB 2x10 each Lateral band walks with RTB above knees x 3 laps at rail Mini squats with TrA with UE support 2x10   02/04/24 Reviewed current function, home management strategies, pain levels, and response to prior treatment.  PT assessed lumbar AROM and LE strength.  See above Pt completed modified Oswestry. See above  Lateral band walks with RTB above knees x 3 laps at rail Mini squats with TrA with UE support at rail x 10 reps Standing paloff press with TrA with RTB x 10 each Supine alt hip extension arc with TrA x10 reps   01/30/24 Supine SLR with contralateral UE extenion with TRA x10 each Supine alt hip extension arc with TrA x10, 6 reps Supine bridge with clams with RTB with TrA x 10 and with GTB  x10 Sidestepping with TrA x 1 lap and with RTB above knees x 2 laps Standing alternating shoulder extension with TrA with RTB 2x10 Standing paloff press with TrA with RTB x 10 each  Manual Therapy:   STM and TPR to bilat sided lumbar paraspinals, and R QL in prone  PT educated pt using her access code to begin using the MedbridgeGO app      PATIENT EDUCATION:  Education details:  exercise form, HEP, POC, scope of PT, relevant anatomy, and rationale of interventions.  PT answered pt's questions. Person educated: Patient Education method: Explanation, Demonstration, verbal cues Education comprehension: verbalized understanding, returned demonstration, verbal cues required   HOME EXERCISE PROGRAM: Access Code: [email or web address] URL: [email or web address] Date: 12/12/2023 Prepared by: Prentice Zaunegger  Exercises - Supine Bridge  - 1 x daily - 7 x weekly - 2 sets - 10 reps - Supine Lower Trunk Rotation  - 2 x daily - 7 x weekly - 2 sets - 10 reps - Supine Transversus Abdominis Bracing - Hands on Stomach  - 2 x daily - 7 x weekly - 2 sets - 10 reps - 5 seconds hold - Supine March  - 1 x daily - 7 x weekly - 2 sets - 10 reps - Supine Core Control with Leg Extension  - 1 x daily - 7 x weekly - 2 sets - 10 reps - Hooklying Clamshell with Resistance  - 1 x daily - 5 x weekly - 2 sets - 10 reps - Standing Shoulder Row with Anchored Resistance  - 1 x daily - 4-5 x weekly - 2 sets - 10 reps - Modified Thomas Stretch  - 1 x daily - 7 x weekly - 2-3 reps - 20 seconds hold  ASSESSMENT:  CLINICAL IMPRESSION: Pt met with ortho MD and he gave her an order to continue PT.  She continues to have pain with twisting.  Pt performed exercises to improve lumbopelvic stability and thoracic/lumbar mobility.  Pt has improved core strength as evidenced by performance of dead bugs.  She tolerated exercises well without c/o's.  PT performed STM  and TPR to improve soft tissue tissue tightness and mobility  and pain and to reduce myofascial restrictions.  She responded well to treatment stating she feels better and has improved pain to 1/10 after treatment.    Pt had to go the locker room at beginning of treatment which was not billed time.   OBJECTIVE IMPAIRMENTS: decreased activity tolerance, decreased balance, decreased endurance, decreased mobility, difficulty walking, decreased ROM, decreased strength, hypomobility, increased muscle spasms, impaired flexibility, improper body mechanics, postural dysfunction, and pain  ACTIVITY LIMITATIONS: carrying, lifting, bending, standing, squatting, stairs, transfers, bed mobility, reach over head, locomotion level, and caring for others  PARTICIPATION LIMITATIONS:  meal prep, cleaning, laundry, shopping, community activity, and yard work  PERSONAL FACTORS: Profession and Time since onset of injury/illness/exacerbation are also affecting patient's functional outcome.   REHAB POTENTIAL: Good  CLINICAL DECISION MAKING: Evolving/moderate complexity  EVALUATION COMPLEXITY: Moderate   GOALS: Goals reviewed with patient? Yes  SHORT TERM GOALS: Target date: 01/09/2024    Patient will be independent with HEP in order to improve functional outcomes. Baseline:  Goal status: GOAL MET  11/11  2.  Patient will report at least 25% improvement in symptoms for improved quality of life. Baseline:  Goal status: GOAL MET  11/11    LONG TERM GOALS: Target date: 03/10/2024    Patient will report at least 75% improvement in symptoms for improved quality of life. Baseline:  Goal status:  PROGRESSING   2.  Patient will improve Modified Oswestry score by at least 10 points in order to indicate improved tolerance to activity. Baseline: 12/50 Goal status: ONGOING  3.  Patient will demonstrate full lumbar ROM in all restricted planes for improved ability to move trunk while completing chores/work duties. Baseline:  Goal status: NOT MET  4.  Patient will  be able to return to all activities unrestricted for improved ability to perform work functions and  exercise.  Baseline:  Goal status: ONGOING  5.  Patient will demonstrate grade of 5/5 MMT grade in all tested musculature as evidence of improved strength to assist with stair ambulation and gait.   Baseline:  Goal status: NOT MET   PLAN:  PT FREQUENCY: 1-2x/week  PT DURATION:  5 weeks   PLANNED INTERVENTIONS: 97164- PT Re-evaluation, 97110-Therapeutic exercises, 97530- Therapeutic activity, [id number]- Neuromuscular re-education, 97535- Self Care, [id number]- Manual therapy, [id number]- Gait training, [phone]- Orthotic Fit/training, [id number]- Canalith repositioning, [id number]- Aquatic Therapy, [phone]- Splinting, [phone]- Wound care (first 20 sq cm), 97598- Wound care (each additional 20 sq cm)Patient/Family education, Balance training, Stair training, Taping, Dry Needling, Joint mobilization, Joint manipulation, Spinal manipulation, Spinal mobilization, Scar mobilization, and DME instructions.  PLAN FOR NEXT SESSION: f/u with HEP, hip and core strength, postural strength, spinal mobility, manual for pain/mobility.  Recert next visit.   Leigh Minerva III PT, DPT 03/11/2024 3:33 PM

## 2024-03-17 ENCOUNTER — Encounter (HOSPITAL_BASED_OUTPATIENT_CLINIC_OR_DEPARTMENT_OTHER): Admitting: Physical Therapy

## 2024-03-24 ENCOUNTER — Ambulatory Visit (HOSPITAL_BASED_OUTPATIENT_CLINIC_OR_DEPARTMENT_OTHER): Payer: Worker's Compensation | Admitting: Physical Therapy

## 2024-03-24 ENCOUNTER — Encounter (HOSPITAL_BASED_OUTPATIENT_CLINIC_OR_DEPARTMENT_OTHER): Payer: Self-pay | Admitting: Physical Therapy

## 2024-03-24 DIAGNOSIS — M5459 Other low back pain: Secondary | ICD-10-CM | POA: Diagnosis not present

## 2024-03-24 DIAGNOSIS — M6281 Muscle weakness (generalized): Secondary | ICD-10-CM

## 2024-03-24 DIAGNOSIS — R29898 Other symptoms and signs involving the musculoskeletal system: Secondary | ICD-10-CM

## 2024-03-24 NOTE — Therapy (Signed)
 " OUTPATIENT PHYSICAL THERAPY THORACOLUMBAR TREATMENT    Patient Name: Nicole Davila MRN: [id number] DOB:02-Nov-1960, 63 y.o., female Today's Date: 03/25/2024  END OF SESSION:  PT End of Session - 03/24/24 1158     Visit Number 16    Number of Visits 20    Date for Recertification  04/21/24    Authorization Type Worker's Comp    PT Start Time 1156    PT Stop Time 1239    PT Time Calculation (min) 43 min    Activity Tolerance Patient tolerated treatment well    Behavior During Therapy WFL for tasks assessed/performed                 History reviewed. No pertinent past medical history. History reviewed. No pertinent surgical history. Patient Active Problem List   Diagnosis Date Noted   Pain in joint, ankle and foot 07/05/2016   Metatarsalgia of right foot 07/05/2016   Abnormality of gait 07/05/2016    PCP: Aisha Harvey, MD   REFERRING PROVIDER: Candance Jeoffrey SAILOR, PA-C  REFERRING DIAG: M54.50 (ICD-10-CM) - Low back pain, unspecified  Rationale for Evaluation and Treatment: Rehabilitation  THERAPY DIAG:  Other low back pain  Muscle weakness (generalized)  Other symptoms and signs involving the musculoskeletal system  ONSET DATE: June 2025  SUBJECTIVE:                                                                                                                                                                                           SUBJECTIVE STATEMENT: Pt denies any adverse effects after prior treatment.  Pt is performing her walking program at [facility] and [facility] though not as much over the holiday.  Pt has been performing the the open book exercise.   Pt has pain with twisting movements.  Pt has pain with sitting, though not as much as she used to.  Patient reports an 80% improvement in pain and symptoms.  Pt feels a little more mobility.  Pt is not picking up anything heavy though has improved tolerance with lifting.  Pt has no pain with carrying laundry  basket.   She reports having pain when standing up from sitting.  Pt's pain comes and goes.  She woke up with back pain this AM though went away as she moved around.    Pt met with ortho MD on 03/06/24.  MD gave her an order to continue PT.  MD note indicated to continue with PT and progress to work conditioning.     Pt is a member at [employer].  PERTINENT HISTORY:  CAD, GERD Restriction of lifting no > 10  lbs per PA note  PAIN:  Are you having pain? Yes: NPRS scale: 2/10 current, 4/10 worst, 0/10 best Pain location: R sided lumbar  Pain description: sharp Aggravating factors: standing 10-15 mins, sitting.  Worst pain is with twisting Relieving factors: heat, walking  PRECAUTIONS: None  WEIGHT BEARING RESTRICTIONS: No  FALLS:  Has patient fallen in last 6 months? No  OCCUPATION: librarian  PLOF: Independent  PATIENT GOALS: Wants to know what she can do, Back to feel better.  Pt wants to establish home program and to improve pain with twisting.    OBJECTIVE: (objective measures from initial evaluation unless otherwise dated)  PATIENT SURVEYS:  Modified Oswestry:  MODIFIED OSWESTRY DISABILITY SCALE    Date: 12/12/23 Score 11/11 12/30  Pain intensity 1 = The pain is bad, but I can manage without having to take (1) I can stand as long as I want but, it increases my pain. pain medication. 0  0  2. Personal care (washing, dressing, etc.) 0 =  I can take care of myself normally without causing increased pain. 1 0  3. Lifting 4 = I can lift only very light weights 4 2  4. Walking 1 = Pain prevents me from walking more than 1 mile. 0 0  5. Sitting 3 =  Pain prevents me from sitting more than  hour. 2 2  6. Standing 1 =  I can stand as long as I want but, it increases my pain. 2 2  7. Sleeping 0 = Pain does not prevent me from sleeping well. 0 1  8. Social Life 0 = My social life is normal and does not increase my pain. 0 0  9. Traveling 1 =  I can travel anywhere, but it  increases my pain. 1 0  10. Employment/ Homemaking 1 = My normal homemaking/job activities increase my pain, but I can still perform all that is required of me 1 0  Total 12/50 11/50 = 22% 7/50 = 14%   Interpretation of scores: Score Category Description  0-20% Minimal Disability The patient can cope with most living activities. Usually no treatment is indicated apart from advice on lifting, sitting and exercise  21-40% Moderate Disability The patient experiences more pain and difficulty with sitting, lifting and standing. Travel and social life are more difficult and they may be disabled from work. Personal care, sexual activity and sleeping are not grossly affected, and the patient can usually be managed by conservative means  41-60% Severe Disability Pain remains the main problem in this group, but activities of daily living are affected. These patients require a detailed investigation  61-80% Crippled Back pain impinges on all aspects of the patients life. Positive intervention is required  81-100% Bed-bound  These patients are either bed-bound or exaggerating their symptoms  Bluford FORBES [name], et al. Surgery versus conservative management of stable thoracolumbar fracture: the PRESTO feasibility RCT. [place] ([place]): [employer]; 2021 Nov. [facility] Technology Assessment, No. 25.62.) Appendix 3, Oswestry Disability Index category descriptors. Available from: Findjewelers.cz  Minimally Clinically Important Difference (MCID) = 12.8%  SCREENING FOR RED FLAGS: Bowel or bladder incontinence: No Spinal tumors: No Cauda equina syndrome: No Compression fracture: No Abdominal aneurysm: No  COGNITION: Overall cognitive status: Within functional limits for tasks assessed     SENSATION: WFL    POSTURE: decreased lumbar lordosis  PALPATION: Hypomobile grossly in thoracic and lumbar spine, tenderness mid thoracic and upper lumbar UPA; TTP  bilateral but R>L paraspinals  and QL, R glute tenderness; notes increased tenderness at R L5 paraspinals following testing  LUMBAR ROM: stretch with rotation bilateral  AROM eval 11/11 12/30   Flexion 0% limited WNL WNL  Extension 0% limited WFL WNL  Right lateral flexion 0% limited 10% limited  10% limited  Left lateral flexion 25% limited* 25% limited 10% limited  Right rotation 0% limited 25% limited WFL  Left rotation 0% limited WFL WFL   (Blank rows = not tested) * = pain/symptoms   LOWER EXTREMITY MMT:    MMT Right eval Left eval Right 11/11 Left 11/11 Right 12/30 Left 12/30  Hip flexion 4+ 4+ 4/5 4+/5 4+/5 4/5  Hip extension 4- 4- 4-/5 4+/5 4-/5 4-/5  Hip abduction 4- * 4- 3+/5 4-/5 4+/5 4+/5  Hip adduction        Hip internal rotation        Hip external rotation        Knee flexion 5 5 5/5 5/5    Knee extension 5 5 4/5 4/5 4+/5 4/5  Ankle dorsiflexion 5 5      Ankle plantarflexion        Ankle inversion        Ankle eversion         (Blank rows = not tested) * = pain/symptoms    TODAY'S TREATMENT:                                                                                                                              DATE:  03/24/24 Reviewed current function, pain levels, and response to prior treatment. Assessed lumbar AROM and LE strength.  See above. Standing paloff press with 5# x10 each Standing chops with RTB with TrA 2x10 each S/L open book exercise x 10 each  Pt completed Modified Oswestry Disability index.  See above  03/10/24 Reviewed pt presentation, pain level, and response to prior treatment.   Pt ambulated 3 laps in the clinic to warm up. Supine dead bug with feet unsupported 2x10 S/L open book exercise x 10 each Supine lumbar rotation 2x10 Standing paloff press with TrA with GTB 2x10 each Chops with RTB with TrA 2x 10  STM and TPR to bilat lumbar paraspinals in prone.    03/02/24 Pt ambulated 3 laps in the clinic to warm  up. Supine dead bug with feet unsupported 2x10 Supine lumbar rotation 2x10  Assessed sx's with open book movement and the movement she did earlier this AM  S/L grade II-III neutral gapping joint mobs  x 2 repts in R S/L'ing  and x 3 reps in L S/Ling Re-assessed after Neutral gapping joint mobs  Supine piriformis stretch 2x30 sec bilat STM to bilat lumbar paraspinals in prone   02/24/24 Pt ambulated 3 laps in the clinic to warm up. Supine dead bug with feet unsupported x6,5 reps Supine SLR with contralateral UE extenion with TrA x10 each Q-ped alt UE flexion with TrA  x 10 Q-ped alt LE extension with TrA x10 Standing paloff press with TrA with RTB 2x10 each Chops with RTB with TrA x 10 Lateral band walks with RTB with TrA 3x10  Manual Therapy:   STM and TPR to bilat lumbar paraspinals and QL in prone    02/18/24 Reviewed pt presentation, pain level, response to prior treatment, and HEP compliance.  Pt ambulated 3 laps in the clinic to warm up. Supine SLR with contralateral UE extenion with TrA 2x10 each Supine dead bug with feet unsupported x8,6 reps Q-ped alt UE flexion with TrA x 10 Q-ped alt LE extension with TrA x10 Lateral band walks with RTB above knees x 3 laps at rail  Manual Therapy:   STM and TPR to bilat lumbar paraspinals and QL in prone   02/12/24 Manual Therapy:   STM and TPR to bilat sided lumbar paraspinals, and R QL in prone  Supine SLR with contralateral UE extenion with TRA 2x10 each Supine hip extension arc 2x10 Attempted q-ped birddogs Q-ped alt UE flexion with TrA x 10 Q-ped alt hip extension with TrA x 8 Standing paloff press with TrA with RTB 2x10 each Lateral band walks with RTB above knees x 3 laps at rail Mini squats with TrA with UE support 2x10   02/04/24 Reviewed current function, home management strategies, pain levels, and response to prior treatment.  PT assessed lumbar AROM and LE strength.  See above Pt completed modified  Oswestry. See above  Lateral band walks with RTB above knees x 3 laps at rail Mini squats with TrA with UE support at rail x 10 reps Standing paloff press with TrA with RTB x 10 each Supine alt hip extension arc with TrA x10 reps     PATIENT EDUCATION:  Education details:  exercise form, HEP, POC, scope of PT, relevant anatomy, and rationale of interventions.  PT answered pt's questions. Person educated: Patient Education method: Explanation, Demonstration, verbal cues Education comprehension: verbalized understanding, returned demonstration, verbal cues required   HOME EXERCISE PROGRAM: Access Code: [email or web address] URL: [email or web address] Date: 12/12/2023 Prepared by: Prentice Zaunegger  Exercises - Supine Bridge  - 1 x daily - 7 x weekly - 2 sets - 10 reps - Supine Lower Trunk Rotation  - 2 x daily - 7 x weekly - 2 sets - 10 reps - Supine Transversus Abdominis Bracing - Hands on Stomach  - 2 x daily - 7 x weekly - 2 sets - 10 reps - 5 seconds hold - Supine March  - 1 x daily - 7 x weekly - 2 sets - 10 reps - Supine Core Control with Leg Extension  - 1 x daily - 7 x weekly - 2 sets - 10 reps - Hooklying Clamshell with Resistance  - 1 x daily - 5 x weekly - 2 sets - 10 reps - Standing Shoulder Row with Anchored Resistance  - 1 x daily - 4-5 x weekly - 2 sets - 10 reps - Modified Thomas Stretch  - 1 x daily - 7 x weekly - 2-3 reps - 20 seconds hold  ASSESSMENT:  CLINICAL IMPRESSION: Pt is making good progress in PT though continues to have pain with twisting.  She continues to have pain with sitting, though not as much as she used to.  Patient reports an 80% improvement in pain and symptoms.  Pt is limited with lifting though has improved tolerance with lifting.  Pt has no pain with carrying laundry basket.  She reports  having pain when standing up from sitting.  Pt demonstrates improved R hip and knee strength and L hip abd strength.  She is weaker in L hip flexion and  extension.  Pt is improving with core strength as evidenced by performance and progression of exercises.  Pt demonstrates improved lumbar AROM in R rotation, L Sb'ing, and extension.  Pt demonstrates improved self perceived disability with improvement in Oswestry though not clinically significant.  Pt has met all STG's and 1/5 LTG's.  She should benefit from continued skilled PT to address impairments and ongoing goals and to assist in restoring PLOF.   OBJECTIVE IMPAIRMENTS: decreased activity tolerance, decreased balance, decreased endurance, decreased mobility, difficulty walking, decreased ROM, decreased strength, hypomobility, increased muscle spasms, impaired flexibility, improper body mechanics, postural dysfunction, and pain  ACTIVITY LIMITATIONS: carrying, lifting, bending, standing, squatting, stairs, transfers, bed mobility, reach over head, locomotion level, and caring for others  PARTICIPATION LIMITATIONS:  meal prep, cleaning, laundry, shopping, community activity, and yard work  PERSONAL FACTORS: Profession and Time since onset of injury/illness/exacerbation are also affecting patient's functional outcome.   REHAB POTENTIAL: Good  CLINICAL DECISION MAKING: Evolving/moderate complexity  EVALUATION COMPLEXITY: Moderate   GOALS: Goals reviewed with patient? Yes  SHORT TERM GOALS: Target date: 01/09/2024    Patient will be independent with HEP in order to improve functional outcomes. Baseline:  Goal status: GOAL MET  11/11  2.  Patient will report at least 25% improvement in symptoms for improved quality of life. Baseline:  Goal status: GOAL MET  11/11    LONG TERM GOALS: Target date: 04/21/2024    Patient will report at least 75% improvement in symptoms for improved quality of life. Baseline:  Goal status:  GOAL MET  12/30  2.  Patient will improve Modified Oswestry score by at least 10 points in order to indicate improved tolerance to activity. Baseline:  12/50 Goal status: PROGRESSING  12/30  3.  Patient will demonstrate full lumbar ROM in all restricted planes for improved ability to move trunk while completing chores/work duties. Baseline:  Goal status: PROGRESSING   12/30  4.  Patient will be able to return to all activities unrestricted for improved ability to perform work functions and  exercise.  Baseline:  Goal status: ONGOING  5.  Patient will demonstrate grade of 5/5 MMT grade in all tested musculature as evidence of improved strength to assist with stair ambulation and gait.   Baseline:  Goal status: ONGOING   6.  Pt will report at least a 50% improvement in pain with twisting.    Goal status:  INITIAL     PLAN:  PT FREQUENCY: 1-2x/week  PT DURATION:  4 weeks   PLANNED INTERVENTIONS: 97164- PT Re-evaluation, 97110-Therapeutic exercises, 97530- Therapeutic activity, [id number]- Neuromuscular re-education, 97535- Self Care, [id number]- Manual therapy, [id number]- Gait training, [phone]- Orthotic Fit/training, [phone]- Canalith repositioning, [id number]- Aquatic Therapy, [phone]- Splinting, [phone]- Wound care (first 20 sq cm), 97598- Wound care (each additional 20 sq cm)Patient/Family education, Balance training, Stair training, Taping, Dry Needling, Joint mobilization, Joint manipulation, Spinal manipulation, Spinal mobilization, Scar mobilization, and DME instructions.  PLAN FOR NEXT SESSION: f/u with HEP, hip and core strength, postural strength, spinal mobility, manual for pain/mobility.  Recert completed and will send to MD.    Leigh Minerva III PT, DPT 03/25/2024 11:33 AM              "

## 2024-03-31 DIAGNOSIS — Z1231 Encounter for screening mammogram for malignant neoplasm of breast: Secondary | ICD-10-CM

## 2024-04-02 ENCOUNTER — Encounter (HOSPITAL_BASED_OUTPATIENT_CLINIC_OR_DEPARTMENT_OTHER): Payer: Self-pay | Admitting: Physical Therapy

## 2024-04-02 ENCOUNTER — Ambulatory Visit (HOSPITAL_BASED_OUTPATIENT_CLINIC_OR_DEPARTMENT_OTHER): Payer: Worker's Compensation | Attending: Physician Assistant | Admitting: Physical Therapy

## 2024-04-02 ENCOUNTER — Other Ambulatory Visit: Payer: Self-pay

## 2024-04-02 DIAGNOSIS — M5459 Other low back pain: Secondary | ICD-10-CM | POA: Insufficient documentation

## 2024-04-02 DIAGNOSIS — R29898 Other symptoms and signs involving the musculoskeletal system: Secondary | ICD-10-CM | POA: Diagnosis present

## 2024-04-02 DIAGNOSIS — M6281 Muscle weakness (generalized): Secondary | ICD-10-CM | POA: Diagnosis present

## 2024-04-02 NOTE — Therapy (Signed)
 " OUTPATIENT PHYSICAL THERAPY THORACOLUMBAR TREATMENT    Patient Name: Nicole Davila MRN: [id number] DOB:[birth year], 64 y.o., female Today's Date: 04/03/2024  END OF SESSION:  PT End of Session - 04/02/24 0920     Visit Number 17    Number of Visits 20    Date for Recertification  04/21/24    Authorization Type Worker's Comp    PT Start Time [phone]    PT Stop Time 0941    PT Time Calculation (min) 43 min    Activity Tolerance Patient tolerated treatment well    Behavior During Therapy [facility] for tasks assessed/performed                  History reviewed. No pertinent past medical history. History reviewed. No pertinent surgical history. Patient Active Problem List   Diagnosis Date Noted   Pain in joint, ankle and foot 07/05/2016   Metatarsalgia of right foot 07/05/2016   Abnormality of gait 07/05/2016    PCP: Aisha Harvey, MD   REFERRING PROVIDER: Candance Jeoffrey SAILOR, PA-C  REFERRING DIAG: M54.50 (ICD-10-CM) - Low back pain, unspecified  Rationale for Evaluation and Treatment: Rehabilitation  THERAPY DIAG:  Other low back pain  Muscle weakness (generalized)  Other symptoms and signs involving the musculoskeletal system  ONSET DATE: June 2025  SUBJECTIVE:                                                                                                                                                                                           SUBJECTIVE STATEMENT: Pt states she felt fine after prior treatment though did have some soreness.  Pt states she hasn't been doing anything over the past week including her walking program and home exercises due to insomnia.  Pt could feel the strain in her lumbar after twisting and reaching and putting books on higher shelf at work.  Her pain improved w/n about 45 mins of movement.  Pt has been waking up with pain across her lumbar.  Pt returns to MD on 04/17/24.   Pt is a member at [employer].  PERTINENT HISTORY:  CAD,  GERD Restriction of lifting no > 10 lbs per PA note  PAIN:  Are you having pain? Yes: NPRS scale: 2/10 current, 5-6/10 worst, 0/10 best Pain location: R sided lumbar  Pain description: sharp Aggravating factors: standing 10-15 mins, sitting.  Worst pain is with twisting Relieving factors: heat, walking  PRECAUTIONS: None  WEIGHT BEARING RESTRICTIONS: No  FALLS:  Has patient fallen in last 6 months? No  OCCUPATION: librarian  PLOF: Independent  PATIENT GOALS: Wants to know what she can do,  Back to feel better   OBJECTIVE: (objective measures from initial evaluation unless otherwise dated)  PATIENT SURVEYS:  Modified Oswestry:  MODIFIED OSWESTRY DISABILITY SCALE   Date: 12/12/23 Score 11/11  Pain intensity 1 = The pain is bad, but I can manage without having to take (1) I can stand as long as I want but, it increases my pain. pain medication. 0   2. Personal care (washing, dressing, etc.) 0 =  I can take care of myself normally without causing increased pain. 1  3. Lifting 4 = I can lift only very light weights 4  4. Walking 1 = Pain prevents me from walking more than 1 mile. 0  5. Sitting 3 =  Pain prevents me from sitting more than  hour. 2  6. Standing 1 =  I can stand as long as I want but, it increases my pain. 2  7. Sleeping 0 = Pain does not prevent me from sleeping well. 0  8. Social Life 0 = My social life is normal and does not increase my pain. 0  9. Traveling 1 =  I can travel anywhere, but it increases my pain. 1  10. Employment/ Homemaking 1 = My normal homemaking/job activities increase my pain, but I can still perform all that is required of me 1  Total 12/50 11/50 = 22%   Interpretation of scores: Score Category Description  0-20% Minimal Disability The patient can cope with most living activities. Usually no treatment is indicated apart from advice on lifting, sitting and exercise  21-40% Moderate Disability The patient experiences more pain and  difficulty with sitting, lifting and standing. Travel and social life are more difficult and they may be disabled from work. Personal care, sexual activity and sleeping are not grossly affected, and the patient can usually be managed by conservative means  41-60% Severe Disability Pain remains the main problem in this group, but activities of daily living are affected. These patients require a detailed investigation  61-80% Crippled Back pain impinges on all aspects of the patients life. Positive intervention is required  81-100% Bed-bound  These patients are either bed-bound or exaggerating their symptoms  Bluford FORBES [name], et al. Surgery versus conservative management of stable thoracolumbar fracture: the PRESTO feasibility RCT. [place] ([place]): [employer]; 2021 Nov. [facility] Technology Assessment, No. 25.62.) Appendix 3, Oswestry Disability Index category descriptors. Available from: Findjewelers.cz  Minimally Clinically Important Difference (MCID) = 12.8%  SCREENING FOR RED FLAGS: Bowel or bladder incontinence: No Spinal tumors: No Cauda equina syndrome: No Compression fracture: No Abdominal aneurysm: No  COGNITION: Overall cognitive status: Within functional limits for tasks assessed     SENSATION: WFL    POSTURE: decreased lumbar lordosis  PALPATION: Hypomobile grossly in thoracic and lumbar spine, tenderness mid thoracic and upper lumbar UPA; TTP bilateral but R>L paraspinals and QL, R glute tenderness; notes increased tenderness at R L5 paraspinals following testing  LUMBAR ROM: stretch with rotation bilateral  AROM eval 11/11  Flexion 0% limited WNL  Extension 0% limited WFL  Right lateral flexion 0% limited 10% limited   Left lateral flexion 25% limited* 25% limited  Right rotation 0% limited 25% limited  Left rotation 0% limited WFL   (Blank rows = not tested) * = pain/symptoms  LOWER EXTREMITY ROM:   WFL for  tasks assessed  Active  Right eval Left eval  Hip flexion    Hip extension    Hip abduction    Hip  adduction    Hip internal rotation    Hip external rotation    Knee flexion    Knee extension    Ankle dorsiflexion    Ankle plantarflexion    Ankle inversion    Ankle eversion     (Blank rows = not tested) * = pain/symptoms  LOWER EXTREMITY MMT:    MMT Right eval Left eval Right 11/11 Left 11/11  Hip flexion 4+ 4+ 4/5 4+/5  Hip extension 4- 4- 4-/5 4+/5  Hip abduction 4- * 4- 3+/5 4-/5  Hip adduction      Hip internal rotation      Hip external rotation      Knee flexion 5 5 5/5 5/5  Knee extension 5 5 4/5 4/5  Ankle dorsiflexion 5 5    Ankle plantarflexion      Ankle inversion      Ankle eversion       (Blank rows = not tested) * = pain/symptoms    FUNCTIONAL TESTS:  5 times sit to stand: 9.57 seconds without UE support, mild L >R  Squat/lifting mechanics: fair mechanics  GAIT: Distance walked: 100 feet Assistive device utilized: None Level of assistance: Complete Independence Comments: Mild R compensated trendelenberg  TODAY'S TREATMENT:                                                                                                                              DATE:  04-21-24 Supine dead bugs 2x10 Supine lumbar rotation 2x10 Supine bridge with clams with TrA 2x10 with GTB S/L open book 2x10 Supine piriformis stretch 2x30 sec bilat Standing paloff press with GTB 2x10 each Standing chops with RTB with TrA x10 each  STM and TPR to bilat lumbar paraspinals in prone.  03/24/24 Reviewed current function, pain levels, and response to prior treatment. Assessed lumbar AROM and LE strength.  See above. Standing paloff press with 5# x10 each Standing chops with RTB with TrA 2x10 each S/L open book exercise x 10 each   Pt completed Modified Oswestry Disability index.  See above  03/10/24 Reviewed pt presentation, pain level, and response to prior  treatment.   Pt ambulated 3 laps in the clinic to warm up. Supine dead bug with feet unsupported 2x10 S/L open book exercise x 10 each Supine lumbar rotation 2x10 Standing paloff press with TrA with GTB 2x10 each Chops with RTB with TrA 2x 10  STM and TPR to bilat lumbar paraspinals in prone.    03/02/24 Pt ambulated 3 laps in the clinic to warm up. Supine dead bug with feet unsupported 2x10 Supine lumbar rotation 2x10   Assessed sx's with open book movement and the movement she did earlier this AM  S/L grade II-III neutral gapping joint mobs  x 2 repts in R S/L'ing  and x 3 reps in L S/Ling Re-assessed after Neutral gapping joint mobs  Supine piriformis stretch 2x30 sec bilat STM to bilat lumbar paraspinals  in prone   02/24/24 Pt ambulated 3 laps in the clinic to warm up. Supine dead bug with feet unsupported x6,5 reps Supine SLR with contralateral UE extenion with TrA x10 each Q-ped alt UE flexion with TrA x 10 Q-ped alt LE extension with TrA x10 Standing paloff press with TrA with RTB 2x10 each Chops with RTB with TrA x 10 Lateral band walks with RTB with TrA 3x10  Manual Therapy:   STM and TPR to bilat lumbar paraspinals and QL in prone       PATIENT EDUCATION:  Education details:  exercise form, HEP, POC, relevant anatomy, and rationale of interventions.  PT answered pt's questions. Person educated: Patient Education method: Explanation, Demonstration, verbal cues Education comprehension: verbalized understanding, returned demonstration, verbal cues required   HOME EXERCISE PROGRAM: Access Code: [email or web address] URL: [email or web address] Date: 12/12/2023 Prepared by: Prentice Zaunegger  Exercises - Supine Bridge  - 1 x daily - 7 x weekly - 2 sets - 10 reps - Supine Lower Trunk Rotation  - 2 x daily - 7 x weekly - 2 sets - 10 reps - Supine Transversus Abdominis Bracing - Hands on Stomach  - 2 x daily - 7 x weekly - 2 sets - 10 reps - 5 seconds  hold - Supine March  - 1 x daily - 7 x weekly - 2 sets - 10 reps - Supine Core Control with Leg Extension  - 1 x daily - 7 x weekly - 2 sets - 10 reps - Hooklying Clamshell with Resistance  - 1 x daily - 5 x weekly - 2 sets - 10 reps - Standing Shoulder Row with Anchored Resistance  - 1 x daily - 4-5 x weekly - 2 sets - 10 reps - Modified Thomas Stretch  - 1 x daily - 7 x weekly - 2-3 reps - 20 seconds hold  ASSESSMENT:  CLINICAL IMPRESSION: Pt continues to have pain with twisting.  Pt performed exercises to improve lumbopelvic stability and thoracic/lumbar mobility including to reduce pain with twisting movements.  She required minimal cuing for correct form with exercises.  Pt felt the piriformis stretch more on her R side.  PT performed STM and TPR to improve soft tissue tissue tightness and mobility and pain and to reduce myofascial restrictions.  She tolerated treatment well and reports improved tightness with pain improving to 1/10 after treatment.  She should benefit from cont skilled PT to address impairments and goals and to improve overall function.     OBJECTIVE IMPAIRMENTS: decreased activity tolerance, decreased balance, decreased endurance, decreased mobility, difficulty walking, decreased ROM, decreased strength, hypomobility, increased muscle spasms, impaired flexibility, improper body mechanics, postural dysfunction, and pain  ACTIVITY LIMITATIONS: carrying, lifting, bending, standing, squatting, stairs, transfers, bed mobility, reach over head, locomotion level, and caring for others  PARTICIPATION LIMITATIONS:  meal prep, cleaning, laundry, shopping, community activity, and yard work  PERSONAL FACTORS: Profession and Time since onset of injury/illness/exacerbation are also affecting patient's functional outcome.   REHAB POTENTIAL: Good  CLINICAL DECISION MAKING: Evolving/moderate complexity  EVALUATION COMPLEXITY: Moderate   GOALS: Goals reviewed with patient?  Yes  SHORT TERM GOALS: Target date: 01/09/2024    Patient will be independent with HEP in order to improve functional outcomes. Baseline:  Goal status: GOAL MET  11/11  2.  Patient will report at least 25% improvement in symptoms for improved quality of life. Baseline:  Goal status: GOAL MET  11/11    LONG TERM GOALS: Target date: 03/10/2024  Patient will report at least 75% improvement in symptoms for improved quality of life. Baseline:  Goal status:  PROGRESSING   2.  Patient will improve Modified Oswestry score by at least 10 points in order to indicate improved tolerance to activity. Baseline: 12/50 Goal status: ONGOING  3.  Patient will demonstrate full lumbar ROM in all restricted planes for improved ability to move trunk while completing chores/work duties. Baseline:  Goal status: NOT MET  4.  Patient will be able to return to all activities unrestricted for improved ability to perform work functions and  exercise.  Baseline:  Goal status: ONGOING  5.  Patient will demonstrate grade of 5/5 MMT grade in all tested musculature as evidence of improved strength to assist with stair ambulation and gait.   Baseline:  Goal status: NOT MET   PLAN:  PT FREQUENCY: 1-2x/week  PT DURATION:  5 weeks   PLANNED INTERVENTIONS: [id number]- PT Re-evaluation, 97110-Therapeutic exercises, 97530- Therapeutic activity, [id number]- Neuromuscular re-education, 97535- Self Care, [id number]- Manual therapy, [id number]- Gait training, [phone]- Orthotic Fit/training, [phone]- Canalith repositioning, [id number]- Aquatic Therapy, [phone]- Splinting, [phone]- Wound care (first 20 sq cm), 97598- Wound care (each additional 20 sq cm)Patient/Family education, Balance training, Stair training, Taping, Dry Needling, Joint mobilization, Joint manipulation, Spinal manipulation, Spinal mobilization, Scar mobilization, and DME instructions.  PLAN FOR NEXT SESSION: f/u with HEP, hip and core strength, postural strength, spinal  mobility, manual for pain/mobility.  Recert next visit.   Leigh Minerva III PT, DPT 04/03/2024 2:28 PM              "

## 2024-04-08 ENCOUNTER — Ambulatory Visit (HOSPITAL_BASED_OUTPATIENT_CLINIC_OR_DEPARTMENT_OTHER): Payer: Self-pay | Admitting: Physical Therapy

## 2024-04-08 ENCOUNTER — Encounter (HOSPITAL_BASED_OUTPATIENT_CLINIC_OR_DEPARTMENT_OTHER): Payer: Self-pay | Admitting: Physical Therapy

## 2024-04-08 DIAGNOSIS — M6281 Muscle weakness (generalized): Secondary | ICD-10-CM

## 2024-04-08 DIAGNOSIS — M5459 Other low back pain: Secondary | ICD-10-CM

## 2024-04-08 DIAGNOSIS — R29898 Other symptoms and signs involving the musculoskeletal system: Secondary | ICD-10-CM

## 2024-04-08 NOTE — Therapy (Signed)
 " OUTPATIENT PHYSICAL THERAPY THORACOLUMBAR TREATMENT    Patient Name: KEYLEIGH MANNINEN MRN: [id number] DOB:05-Aug-1960, 64 y.o., female Today's Date: 04/09/2024  END OF SESSION:     PT End of Session - 04/08/24 0920       Visit Number 18    Number of Visits 20     Date for Recertification  04/21/24     Authorization Type Worker's Comp     PT Start Time [phone]    PT Stop Time 0941     PT Time Calculation (min) 49 min     Activity Tolerance Patient tolerated treatment well     Behavior During Therapy [facility] for tasks assessed/performed              History reviewed. No pertinent past medical history. History reviewed. No pertinent surgical history. Patient Active Problem List   Diagnosis Date Noted   Pain in joint, ankle and foot 07/05/2016   Metatarsalgia of right foot 07/05/2016   Abnormality of gait 07/05/2016    PCP: Aisha Harvey, MD   REFERRING PROVIDER: Candance Jeoffrey SAILOR, PA-C  REFERRING DIAG: M54.50 (ICD-10-CM) - Low back pain, unspecified  Rationale for Evaluation and Treatment: Rehabilitation  THERAPY DIAG:  Other low back pain  Muscle weakness (generalized)  Other symptoms and signs involving the musculoskeletal system  ONSET DATE: June 2025  SUBJECTIVE:                                                                                                                                                                                           SUBJECTIVE STATEMENT: Pt states she is more stiff than painful this AM.  Her back is not really hurting, but feels strained.  Pt has had a crick in her neck for 3 days.  Pt reports having insomnia for 10 days, but was able to sleep well last night.  Lately when her back flares up, it has been going away on its own.  Pt returns to MD on 04/17/24.   Pt is a member at [employer].  PERTINENT HISTORY:  CAD, GERD Restriction of lifting no > 10 lbs per PA note  PAIN:  Are you having pain? Yes: NPRS scale: 1/10 current,  5-6/10 worst, 0/10 best Pain location: R sided lumbar  Pain description: sharp Aggravating factors: standing 10-15 mins, sitting.  Worst pain is with twisting Relieving factors: heat, walking  PRECAUTIONS: None  WEIGHT BEARING RESTRICTIONS: No  FALLS:  Has patient fallen in last 6 months? No  OCCUPATION: librarian  PLOF: Independent  PATIENT GOALS: Wants to know what she can do, Back to feel better   OBJECTIVE: (  objective measures from initial evaluation unless otherwise dated)  PATIENT SURVEYS:  Modified Oswestry:  MODIFIED OSWESTRY DISABILITY SCALE   Date: 12/12/23 Score 11/11  Pain intensity 1 = The pain is bad, but I can manage without having to take (1) I can stand as long as I want but, it increases my pain. pain medication. 0   2. Personal care (washing, dressing, etc.) 0 =  I can take care of myself normally without causing increased pain. 1  3. Lifting 4 = I can lift only very light weights 4  4. Walking 1 = Pain prevents me from walking more than 1 mile. 0  5. Sitting 3 =  Pain prevents me from sitting more than  hour. 2  6. Standing 1 =  I can stand as long as I want but, it increases my pain. 2  7. Sleeping 0 = Pain does not prevent me from sleeping well. 0  8. Social Life 0 = My social life is normal and does not increase my pain. 0  9. Traveling 1 =  I can travel anywhere, but it increases my pain. 1  10. Employment/ Homemaking 1 = My normal homemaking/job activities increase my pain, but I can still perform all that is required of me 1  Total 12/50 11/50 = 22%   Interpretation of scores: Score Category Description  0-20% Minimal Disability The patient can cope with most living activities. Usually no treatment is indicated apart from advice on lifting, sitting and exercise  21-40% Moderate Disability The patient experiences more pain and difficulty with sitting, lifting and standing. Travel and social life are more difficult and they may be disabled from work.  Personal care, sexual activity and sleeping are not grossly affected, and the patient can usually be managed by conservative means  41-60% Severe Disability Pain remains the main problem in this group, but activities of daily living are affected. These patients require a detailed investigation  61-80% Crippled Back pain impinges on all aspects of the patients life. Positive intervention is required  81-100% Bed-bound  These patients are either bed-bound or exaggerating their symptoms  Bluford FORBES [name], et al. Surgery versus conservative management of stable thoracolumbar fracture: the PRESTO feasibility RCT. [place] ([place]): [employer]; 2021 Nov. [facility] Technology Assessment, No. 25.62.) Appendix 3, Oswestry Disability Index category descriptors. Available from: Findjewelers.cz  Minimally Clinically Important Difference (MCID) = 12.8%  SCREENING FOR RED FLAGS: Bowel or bladder incontinence: No Spinal tumors: No Cauda equina syndrome: No Compression fracture: No Abdominal aneurysm: No  COGNITION: Overall cognitive status: Within functional limits for tasks assessed     SENSATION: WFL    POSTURE: decreased lumbar lordosis  PALPATION: Hypomobile grossly in thoracic and lumbar spine, tenderness mid thoracic and upper lumbar UPA; TTP bilateral but R>L paraspinals and QL, R glute tenderness; notes increased tenderness at R L5 paraspinals following testing  LUMBAR ROM: stretch with rotation bilateral  AROM eval 11/11  Flexion 0% limited WNL  Extension 0% limited WFL  Right lateral flexion 0% limited 10% limited   Left lateral flexion 25% limited* 25% limited  Right rotation 0% limited 25% limited  Left rotation 0% limited WFL   (Blank rows = not tested) * = pain/symptoms  LOWER EXTREMITY ROM:   WFL for tasks assessed  Active  Right eval Left eval  Hip flexion    Hip extension    Hip abduction    Hip adduction    Hip  internal rotation  Hip external rotation    Knee flexion    Knee extension    Ankle dorsiflexion    Ankle plantarflexion    Ankle inversion    Ankle eversion     (Blank rows = not tested) * = pain/symptoms  LOWER EXTREMITY MMT:    MMT Right eval Left eval Right 11/11 Left 11/11  Hip flexion 4+ 4+ 4/5 4+/5  Hip extension 4- 4- 4-/5 4+/5  Hip abduction 4- * 4- 3+/5 4-/5  Hip adduction      Hip internal rotation      Hip external rotation      Knee flexion 5 5 5/5 5/5  Knee extension 5 5 4/5 4/5  Ankle dorsiflexion 5 5    Ankle plantarflexion      Ankle inversion      Ankle eversion       (Blank rows = not tested) * = pain/symptoms    FUNCTIONAL TESTS:  5 times sit to stand: 9.57 seconds without UE support, mild L >R  Squat/lifting mechanics: fair mechanics  GAIT: Distance walked: 100 feet Assistive device utilized: None Level of assistance: Complete Independence Comments: Mild R compensated trendelenberg  TODAY'S TREATMENT:                                                                                                                              DATE:  04/08/24 Pt ambulated 3 laps in the clinic to warm up. Supine lumbar rotation 2x10 Supine bridge with clams with GTB with TrA 2x10 Supine dead bugs 2x10 Q-ped alt UE flexion with TrA x10 Q-ped alt LE extension with TrA x10 Q-ped birddogs x7 reps Standing chops with GTB with TrA 2x10 bilat Lateral band walks with TrA with GTB 3x10 Supine piriformis stretch 2x30 sec bilat Attempted open book stretch, but stopped due to vertigo sx's.     2024/04/12 Supine dead bugs 2x10 Supine lumbar rotation 2x10 Supine bridge with clams with TrA 2x10 with GTB S/L open book 2x10 Supine piriformis stretch 2x30 sec bilat Standing paloff press with GTB 2x10 each Standing chops with RTB with TrA x10 each  STM and TPR to bilat lumbar paraspinals in prone.  03/24/24 Reviewed current function, pain levels, and response to prior  treatment. Assessed lumbar AROM and LE strength.  See above. Standing paloff press with 5# x10 each Standing chops with RTB with TrA 2x10 each S/L open book exercise x 10 each   Pt completed Modified Oswestry Disability index.  See above  03/10/24 Reviewed pt presentation, pain level, and response to prior treatment.   Pt ambulated 3 laps in the clinic to warm up. Supine dead bug with feet unsupported 2x10 S/L open book exercise x 10 each Supine lumbar rotation 2x10 Standing paloff press with TrA with GTB 2x10 each Chops with RTB with TrA 2x 10  STM and TPR to bilat lumbar paraspinals in prone.    03/02/24 Pt ambulated 3 laps in the  clinic to warm up. Supine dead bug with feet unsupported 2x10 Supine lumbar rotation 2x10   Assessed sx's with open book movement and the movement she did earlier this AM  S/L grade II-III neutral gapping joint mobs  x 2 repts in R S/L'ing  and x 3 reps in L S/Ling Re-assessed after Neutral gapping joint mobs  Supine piriformis stretch 2x30 sec bilat STM to bilat lumbar paraspinals in prone   02/24/24 Pt ambulated 3 laps in the clinic to warm up. Supine dead bug with feet unsupported x6,5 reps Supine SLR with contralateral UE extenion with TrA x10 each Q-ped alt UE flexion with TrA x 10 Q-ped alt LE extension with TrA x10 Standing paloff press with TrA with RTB 2x10 each Chops with RTB with TrA x 10 Lateral band walks with RTB with TrA 3x10  Manual Therapy:   STM and TPR to bilat lumbar paraspinals and QL in prone       PATIENT EDUCATION:  Education details:  exercise form, HEP, POC, relevant anatomy, and rationale of interventions.  PT answered pt's questions. Person educated: Patient Education method: Explanation, Demonstration, verbal cues Education comprehension: verbalized understanding, returned demonstration, verbal cues required   HOME EXERCISE PROGRAM: Access Code: [email or web address] URL:  [email or web address] Date: 12/12/2023 Prepared by: Prentice Zaunegger  Exercises - Supine Bridge  - 1 x daily - 7 x weekly - 2 sets - 10 reps - Supine Lower Trunk Rotation  - 2 x daily - 7 x weekly - 2 sets - 10 reps - Supine Transversus Abdominis Bracing - Hands on Stomach  - 2 x daily - 7 x weekly - 2 sets - 10 reps - 5 seconds hold - Supine March  - 1 x daily - 7 x weekly - 2 sets - 10 reps - Supine Core Control with Leg Extension  - 1 x daily - 7 x weekly - 2 sets - 10 reps - Hooklying Clamshell with Resistance  - 1 x daily - 5 x weekly - 2 sets - 10 reps - Standing Shoulder Row with Anchored Resistance  - 1 x daily - 4-5 x weekly - 2 sets - 10 reps - Modified Thomas Stretch  - 1 x daily - 7 x weekly - 2-3 reps - 20 seconds hold  ASSESSMENT:  CLINICAL IMPRESSION: Pt is progressing well with pain, sx's, and core strength.  PT progressed exercise intensity today by increasing resistance with standing chops and lateral band walks and adding q-ped birddogs and mini wall sits with diagonals with ball with TrA.  Pt was challenged with q-ped birddogs though had improved performance today being able to performed 7 reps.  Pt performed exercises well and had good tolerance with exercises.  Pt reports having some vertigo sx's this AM.  PT elevated head with supine exercises.  Pt attempted open book exercise though stopped due to vertigo sx's.  She responded well to treatment stating she feels good after treatment and having no change in pain.    OBJECTIVE IMPAIRMENTS: decreased activity tolerance, decreased balance, decreased endurance, decreased mobility, difficulty walking, decreased ROM, decreased strength, hypomobility, increased muscle spasms, impaired flexibility, improper body mechanics, postural dysfunction, and pain  ACTIVITY LIMITATIONS: carrying, lifting, bending, standing, squatting, stairs, transfers, bed mobility, reach over head, locomotion level, and caring for  others  PARTICIPATION LIMITATIONS:  meal prep, cleaning, laundry, shopping, community activity, and yard work  PERSONAL FACTORS: Profession and Time since onset of injury/illness/exacerbation are also affecting patient's functional outcome.   REHAB  POTENTIAL: Good  CLINICAL DECISION MAKING: Evolving/moderate complexity  EVALUATION COMPLEXITY: Moderate   GOALS: Goals reviewed with patient? Yes  SHORT TERM GOALS: Target date: 01/09/2024    Patient will be independent with HEP in order to improve functional outcomes. Baseline:  Goal status: GOAL MET  11/11  2.  Patient will report at least 25% improvement in symptoms for improved quality of life. Baseline:  Goal status: GOAL MET  11/11    LONG TERM GOALS: Target date: 03/10/2024    Patient will report at least 75% improvement in symptoms for improved quality of life. Baseline:  Goal status:  PROGRESSING   2.  Patient will improve Modified Oswestry score by at least 10 points in order to indicate improved tolerance to activity. Baseline: 12/50 Goal status: ONGOING  3.  Patient will demonstrate full lumbar ROM in all restricted planes for improved ability to move trunk while completing chores/work duties. Baseline:  Goal status: NOT MET  4.  Patient will be able to return to all activities unrestricted for improved ability to perform work functions and  exercise.  Baseline:  Goal status: ONGOING  5.  Patient will demonstrate grade of 5/5 MMT grade in all tested musculature as evidence of improved strength to assist with stair ambulation and gait.   Baseline:  Goal status: NOT MET   PLAN:  PT FREQUENCY: 1-2x/week  PT DURATION:  5 weeks   PLANNED INTERVENTIONS: 97164- PT Re-evaluation, 97110-Therapeutic exercises, 97530- Therapeutic activity, [id number]- Neuromuscular re-education, 97535- Self Care, [id number]- Manual therapy, [id number]- Gait training, [phone]- Orthotic Fit/training, [phone]- Canalith repositioning, [id number]- Aquatic  Therapy, [phone]- Splinting, [phone]- Wound care (first 20 sq cm), 97598- Wound care (each additional 20 sq cm)Patient/Family education, Balance training, Stair training, Taping, Dry Needling, Joint mobilization, Joint manipulation, Spinal manipulation, Spinal mobilization, Scar mobilization, and DME instructions.  PLAN FOR NEXT SESSION: f/u with HEP, hip and core strength, postural strength, spinal mobility, manual for pain/mobility.  Recert next visit.   Leigh Minerva III PT, DPT 04/09/24 8:51 PM               "

## 2024-04-10 ENCOUNTER — Ambulatory Visit: Admitting: Internal Medicine

## 2024-04-10 ENCOUNTER — Encounter: Payer: Self-pay | Admitting: Internal Medicine

## 2024-04-10 VITALS — BP 116/74 | HR 89 | Ht 65.0 in | Wt 151.5 lb

## 2024-04-10 DIAGNOSIS — K31A0 Gastric intestinal metaplasia, unspecified: Secondary | ICD-10-CM

## 2024-04-10 DIAGNOSIS — K219 Gastro-esophageal reflux disease without esophagitis: Secondary | ICD-10-CM | POA: Diagnosis not present

## 2024-04-10 DIAGNOSIS — R1310 Dysphagia, unspecified: Secondary | ICD-10-CM | POA: Diagnosis not present

## 2024-04-10 NOTE — Patient Instructions (Addendum)
 _______________________________________________________  If your blood pressure at your visit was 140/90 or greater, please contact your primary care physician to follow up on this.  _______________________________________________________  If you are age 63 or older, your body mass index should be between 23-30. Your Body mass index is 25.21 kg/m. If this is out of the aforementioned range listed, please consider follow up with your Primary Care Provider.  If you are age 63 or younger, your body mass index should be between 19-25. Your Body mass index is 25.21 kg/m. If this is out of the aformentioned range listed, please consider follow up with your Primary Care Provider.   ________________________________________________________  The [facility] providers would like to encourage you to use MYCHART to communicate with providers for non-urgent requests or questions.  Due to long hold times on the telephone, sending your provider a message by [facility] may be a faster and more efficient way to get a response.  Please allow 48 business hours for a response.  Please remember that this is for non-urgent requests.  _______________________________________________________  [facility] is using a team-based approach to care.  Your team is made up of your doctor and two to three APPS. Our APPS (Nurse Practitioners and Physician Assistants) work with your physician to ensure care continuity for you. They are fully qualified to address your health concerns and develop a treatment plan. They communicate directly with your gastroenterologist to care for you. Seeing the Advanced Practice Practitioners on your physician's team can help you by facilitating care more promptly, often allowing for earlier appointments, access to diagnostic testing, procedures, and other specialty referrals.   You were given a GERD handout   You have been scheduled for an endoscopy. Please follow written instructions  given to you at your visit today.  If you use inhalers (even only as needed), please bring them with you on the day of your procedure.  If you take any of the following medications, they will need to be adjusted prior to your procedure:   DO NOT TAKE 7 DAYS PRIOR TO TEST- Trulicity (dulaglutide) Ozempic, Wegovy (semaglutide) Mounjaro, Zepbound (tirzepatide) Bydureon Bcise (exanatide extended release)  DO NOT TAKE 1 DAY PRIOR TO YOUR TEST Rybelsus (semaglutide) Adlyxin (lixisenatide) Victoza (liraglutide) Byetta (exanatide) ___________________________________________________________________________  GERD in Adults: What to Know  Gastroesophageal reflux (GER) is when acid from your stomach flows up into your esophagus. Your esophagus is the part of your body that moves food from your mouth to your stomach. Normally, food goes down and stays in your stomach to be digested. But with GER, food and stomach acid may go back up. You may have a disease called gastroesophageal reflux disease (GERD) if the reflux: Happens often. Causes very bad symptoms. Makes your esophagus sore and swollen. Over time, GERD can make small holes called ulcers in the lining of your esophagus. What are the causes? GERD is caused by a problem with the muscle between your esophagus and stomach. This muscle is called the lower esophageal sphincter (LES). When it's weak or not normal, it doesn't close like it should. This means food and stomach acid can go back up into your esophagus. The muscle can be weak if: You smoke or use products with tobacco in them. You're pregnant. You have a type of hernia called a hiatal hernia. You eat certain foods and drinks. These include: Alcohol. Coffee. Chocolate. Onions. Peppermint. What increases the risk? Being overweight. Having a disease that affects your connective tissue. Taking NSAIDs, such as ibuprofen.  What are the signs or symptoms? Heartburn. Trouble  swallowing. Pain when you swallow. The feeling of having a lump in your throat. A bitter taste in your mouth. Bad breath. Having an upset or bloated stomach. Burping. Chest pain. Other conditions can also cause chest pain. Make sure you see your health care provider if you have chest pain. Wheezing. This is when you make high-pitched whistling sounds when you breathe, most often when you breathe out. A long-term cough or a cough at night. How is this diagnosed? GERD may be diagnosed based on your medical history and a physical exam. You may also have tests. These may include: An endoscopy. This test looks at your stomach and esophagus with a small camera. A barium swallow test. This shows the shape and size of your esophagus and how well it's working. Tests of your esophagus to check for: Acid levels. Pressure. How is this treated? Treatment may depend on how bad your symptoms are. It may include: Changes to your diet and daily life. Medicines. Surgery. Follow these instructions at home: Eating and drinking Follow an eating plan as told by your provider. You may need to avoid certain foods and drinks. These may include: Coffee and tea, with or without caffeine. Alcohol. Energy drinks and sports drinks. Fizzy drinks or sodas. Chocolate and cocoa. Peppermint and mint flavorings. Garlic and onions. Horseradish. Spicy and acidic foods. These include: Peppers. Chili powder and curry powder. Vinegar. Hot sauces and BBQ sauce. Citrus fruits and juices. These include: Oranges. Lemons. Limes. Tomato-based foods. These include: Red sauce and pizza with red sauce. Chili. Salsa. Fried and fatty foods. These include: Donuts. French fries. Potato chips. High-fat dressings. High-fat meats. These include: Hot dogs and sausage. Rib eye steak. Ham and bacon. High-fat dairy items. These include: Whole milk. Butter. Cream cheese. Eat small meals often. Avoid eating big  meals. Avoid drinking lots of liquid with your meals. Try not to eat meals during the 2-3 hours before bedtime. Try not to lie down right after you eat. Do not exercise right after you eat. Lifestyle  If you're overweight, lose an amount of weight that's healthy for you. Ask your provider about a safe weight loss goal. Do not smoke, vape, or use nicotine or tobacco. Wear loose clothes. Do not wear things that are tight around your waist. When you sleep, try: Raising the head of your bed about 6 inches (15 cm). You can use a wedge to do this. Lying down on your left side. Try to lower your stress. If you need help doing this, ask your provider. General instructions Take your medicines only as told. Do not take aspirin or ibuprofen unless you're told to. Watch for any changes in your symptoms. Do not bend over if it makes your symptoms worse. Contact a health care provider if: You have new symptoms. You have trouble: Drinking. Swallowing. Eating. It hurts to swallow. You have wheezing. You have a cough that won't go away. Your voice is hoarse. Your symptoms don't get better with treatment. Get help right away if: You have pain all of a sudden in your: Arm. Neck. Jaw. Teeth. Back. You feel sweaty, dizzy, or light-headed all of a sudden. You faint. You have chest pain or shortness of breath. You vomit and the vomit is: Green, yellow, or black. Looks like blood or coffee grounds. Your poop is red, bloody, or black. These symptoms may be an emergency. Call 911 right away. Do not wait to see if the  symptoms will go away. Do not drive yourself to the hospital. This information is not intended to replace advice given to you by your health care provider. Make sure you discuss any questions you have with your health care provider. Document Revised: 01/22/2023 Document Reviewed: 08/08/2022 Elsevier Patient Education  2024 [employer].  It was a pleasure to see you  today!  Thank you for trusting me with your gastrointestinal care!

## 2024-04-10 NOTE — Progress Notes (Signed)
 "  Chief Complaint: Dysphagia  HPI : 64 year old female with history of bipolar disease, hypothyroidism, GERD, MAFLD, and GIM presents with dysphagia  Discussed the use of AI scribe software for clinical note transcription with the patient, who gave verbal consent to proceed.   She previously saw Dr. Saintclair with [facility]. Last EGD was in 2023 with gastritis (H pylori negative) and focal intestinal metaplasia. Last colonoscopy was in 2019 was normal.   Reflux symptoms are chronic and fluctuate in severity. Over the past week, increased reflux with associated sore throat and frequent episodes of tasting food or specific triggers, notably garlic, after eating. Sensation described as chest discomfort, like it's pushing from the inside, rather than burning pain. Increased mucus production affects her voice. Sore throat occurs during flares and resolves as reflux improves. No nocturnal worsening and positional changes do not provide relief. Sometimes feels food sitting in her chest after eating. Reactions to foods are inconsistent, with no clear correlation between specific foods and symptoms.  Intermittent dysphagia occurs approximately once per week, with food getting stuck in her chest, particularly with nuts, fish, chicken, and occasionally capsules such as magnesium. At least one episode of food impaction resolved without medical intervention. Denies blood in stool and unintentional weight loss. No abdominal pain except when constipated. The patient does not recall any polyps being identified on prior colonoscopies.  Constipation is intermittent and was recently confirmed by pelvic ultrasound as a very full colon. Previously had chronic diarrhea, which improved significantly since her mother's passing. Dietary modifications have been made, and she recently resumed drinking coffee once daily to try to help with constipation. Generally eats a healthy diet with vegetables, grains, fish, and chicken,  but also eats less healthy foods occasionally. Garlic is avoided due to its tendency to trigger symptoms, though reactions are inconsistent.  Pantoprazole prescribed but not taken consistently; unsure of efficacy. Previously used omeprazole, which was changed due to lack of efficacy. Dietary modifications include eliminating caffeine and alcohol, though coffee was recently resumed for constipation. Alcohol is not consumed regularly due to potential medication interactions with Lithium  History of shortness of breath, previously attributed to prior smoking history, but was told by a previous provider it could be related to reflux. No exertional chest pain or worsening of symptoms with activity.   Cardiac workup in August 2024 showed a coronary calcium score of 11.9. No family history of stomach cancer. Mother had IBS and reflux.  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 Happily married to her wife. She has a grown daughter.  Wt Readings from Last 3 Encounters:  04/10/24 151 lb 8 oz (68.7 kg)  02/21/17 145 lb (65.8 kg)  08/21/16 143 lb (64.9 kg)    Past Medical History:  Diagnosis Date   Anxiety    Arthritis    Barrett esophagus    Chronic headaches    Depression    Elevated cholesterol    GERD (gastroesophageal reflux disease)    IBS (irritable bowel syndrome)    Thyroid  disease    UTI (urinary tract infection)      Past Surgical History:  Procedure Laterality Date   CESAREAN SECTION     TONSILLECTOMY     Family History  Problem Relation Age of Onset   Breast cancer Mother    Irritable bowel syndrome Mother    Cancer Father        maybe prostate   Thyroid  cancer Brother    Colon cancer  Neg Hx    Social History[1] Current Outpatient Medications  Medication Sig Dispense Refill   ezetimibe (ZETIA) 10 MG tablet Take 10 mg by mouth daily.     levothyroxine (SYNTHROID) 88 MCG tablet Take 88 mcg by mouth as directed. Take 1/2 tablet one day and take 1 tablet the other 6  days     lithium carbonate (LITHOBID) 300 MG CR tablet Take 300 mg by mouth 2 (two) times daily.     pantoprazole (PROTONIX) 40 MG tablet Take 40 mg by mouth 2 (two) times daily before a meal.     rosuvastatin (CRESTOR) 10 MG tablet Take 10 mg by mouth daily.     albuterol  (VENTOLIN  HFA) 108 (90 Base) MCG/ACT inhaler Inhale 1-2 puffs into the lungs every 6 (six) hours as needed for wheezing or shortness of breath. (Patient not taking: Reported on 04/10/2024) 1 each 0   meclizine (ANTIVERT) 25 MG tablet Take 25 mg by mouth 2 (two) times daily as needed. (Patient not taking: Reported on 04/10/2024)  0   No current facility-administered medications for this visit.   Allergies[2]   Review of Systems: All systems reviewed and negative except where noted in HPI.   Physical Exam: BP 116/74   Pulse 89   Ht 5' 5 (1.651 m)   Wt 151 lb 8 oz (68.7 kg)   BMI 25.21 kg/m  Constitutional: Pleasant,well-developed, female in no acute distress. HEENT: Normocephalic and atraumatic. Conjunctivae are normal. No scleral icterus. Cardiovascular: Normal rate, regular rhythm.  Pulmonary/chest: Effort normal and breath sounds normal. No wheezing, rales or rhonchi. Abdominal: Soft, nondistended, nontender. Bowel sounds active throughout. There are no masses palpable. No hepatomegaly. Extremities: Trace edema Neurological: Alert and oriented to person place and time. Skin: Skin is warm and dry. No rashes noted. Psychiatric: Normal mood and affect. Behavior is normal.  Labs 02/2023: CBC nml. BMP with mildly elevated Ca of 10.6  Barium swallow 02/13/19: IMPRESSION: Normal double contrast barium esophagram. No findings to explain the patient's history of solid food dysphagia.  CT cardiac 11/08/22: normal  Last colonoscopy was in 2019 (media tab 03/13/24) was normal.  Last EGD was in 2023 (media tab 03/13/24) with gastritis (H pylori negative) and focal intestinal metaplasia.   ASSESSMENT AND  PLAN: GERD Dysphagia GIM Patient presents with longstanding acid reflux that has been challenging to control, which manifests as chest discomfort and regurgitation. She has dysphagia particularly when her reflux flares up. Not a clear response to PPI therapy, though she does admit to not consistently taking her pantoprazole therapy. She does have sensitivity to certain foods. I went over conservative strategies to help with GERD. Will plan for an EGD for further evaluation to rule out EoE, plan for empiric dilation, and evaluate for any underlying esophagitis/Barrett's esophagus. She does have a history of focal GIM but no obvious risk factors so surveillance is not ncessarily required. - GERD handout - EGD [facility] with empiric dilation - Next colonoscopy due in 2030 for colon cancer screening  Estefana Kidney, MD  I spent 48 minutes of time, including in depth chart review, independent review of results as outlined above, communicating results with the patient directly, face-to-face time with the patient, coordinating care, ordering studies and medications as appropriate, and documentation.      [1]  Social History Tobacco Use   Smoking status: Former    Types: Cigarettes   Smokeless tobacco: Never   Tobacco comments:    Quit over 30 years ago  Psychologist, Educational  Use   Vaping status: Never Used  Substance Use Topics   Alcohol use: Yes    Comment: occ 1-2 a week   Drug use: Never  [2]  Allergies Allergen Reactions   Nsaids     Intolerance with Lithium   "

## 2024-04-15 ENCOUNTER — Telehealth: Payer: Self-pay | Admitting: Internal Medicine

## 2024-04-15 NOTE — Telephone Encounter (Signed)
 LVM that letter is in her mychart but if she needs to pick one up she can

## 2024-04-15 NOTE — Telephone Encounter (Signed)
 Patient is requesting for a work note for 1/16 office visit. Wishing to know if it can be emailed. If not she can come in tomorrow morning for note. Please advise, thank you

## 2024-04-16 ENCOUNTER — Encounter (HOSPITAL_BASED_OUTPATIENT_CLINIC_OR_DEPARTMENT_OTHER): Payer: Self-pay | Admitting: Physical Therapy

## 2024-04-16 ENCOUNTER — Ambulatory Visit (HOSPITAL_BASED_OUTPATIENT_CLINIC_OR_DEPARTMENT_OTHER): Payer: Worker's Compensation | Admitting: Physical Therapy

## 2024-04-16 DIAGNOSIS — M6281 Muscle weakness (generalized): Secondary | ICD-10-CM

## 2024-04-16 DIAGNOSIS — R29898 Other symptoms and signs involving the musculoskeletal system: Secondary | ICD-10-CM

## 2024-04-16 DIAGNOSIS — M5459 Other low back pain: Secondary | ICD-10-CM

## 2024-04-16 NOTE — Therapy (Signed)
 " OUTPATIENT PHYSICAL THERAPY THORACOLUMBAR TREATMENT    Patient Name: Nicole Davila MRN: [id number] DOB:[birth year], 64 y.o., female Today's Date: 04/17/2024  END OF SESSION:  PT End of Session - 04/16/24 0945     Visit Number 19    Number of Visits 20    Date for Recertification  04/21/24    Authorization Type Worker's Comp    PT Start Time 0940    PT Stop Time 1022    PT Time Calculation (min) 42 min    Activity Tolerance Patient tolerated treatment well    Behavior During Therapy WFL for tasks assessed/performed                  Past Medical History:  Diagnosis Date   Anxiety    Arthritis    Barrett esophagus    Chronic headaches    Depression    Elevated cholesterol    GERD (gastroesophageal reflux disease)    IBS (irritable bowel syndrome)    Thyroid  disease    UTI (urinary tract infection)    Past Surgical History:  Procedure Laterality Date   CESAREAN SECTION     TONSILLECTOMY     Patient Active Problem List   Diagnosis Date Noted   Pain in joint, ankle and foot 07/05/2016   Metatarsalgia of right foot 07/05/2016   Abnormality of gait 07/05/2016    PCP: Aisha Harvey, MD   REFERRING PROVIDER: Candance Jeoffrey SAILOR, PA-C  REFERRING DIAG: M54.50 (ICD-10-CM) - Low back pain, unspecified  Rationale for Evaluation and Treatment: Rehabilitation  THERAPY DIAG:  Other low back pain  Muscle weakness (generalized)  Other symptoms and signs involving the musculoskeletal system  ONSET DATE: June 2025  SUBJECTIVE:                                                                                                                                                                                           SUBJECTIVE STATEMENT: Pt states she's been doing ok.  Still having pain with twisting though the pain doesn't last as long.  Pt denies any adverse effects after prior treatment.   Pt returns to MD on 04/17/24.   Pt is a member at [employer].  PERTINENT  HISTORY:  CAD, GERD Restriction of lifting no > 10 lbs per PA note  PAIN:  Are you having pain? Yes: NPRS scale: 0/10 current, 5-6/10 worst, 0/10 best Pain location: R sided lumbar  Pain description: sharp Aggravating factors: standing 10-15 mins, sitting.  Worst pain is with twisting Relieving factors: heat, walking  PRECAUTIONS: None  WEIGHT BEARING RESTRICTIONS: No  FALLS:  Has patient fallen in  last 6 months? No  OCCUPATION: librarian  PLOF: Independent  PATIENT GOALS: Wants to know what she can do, Back to feel better   OBJECTIVE: (objective measures from initial evaluation unless otherwise dated)  PATIENT SURVEYS:  Modified Oswestry:  MODIFIED OSWESTRY DISABILITY SCALE   Date: 12/12/23 Score 11/11  Pain intensity 1 = The pain is bad, but I can manage without having to take (1) I can stand as long as I want but, it increases my pain. pain medication. 0   2. Personal care (washing, dressing, etc.) 0 =  I can take care of myself normally without causing increased pain. 1  3. Lifting 4 = I can lift only very light weights 4  4. Walking 1 = Pain prevents me from walking more than 1 mile. 0  5. Sitting 3 =  Pain prevents me from sitting more than  hour. 2  6. Standing 1 =  I can stand as long as I want but, it increases my pain. 2  7. Sleeping 0 = Pain does not prevent me from sleeping well. 0  8. Social Life 0 = My social life is normal and does not increase my pain. 0  9. Traveling 1 =  I can travel anywhere, but it increases my pain. 1  10. Employment/ Homemaking 1 = My normal homemaking/job activities increase my pain, but I can still perform all that is required of me 1  Total 12/50 11/50 = 22%   Interpretation of scores: Score Category Description  0-20% Minimal Disability The patient can cope with most living activities. Usually no treatment is indicated apart from advice on lifting, sitting and exercise  21-40% Moderate Disability The patient experiences more  pain and difficulty with sitting, lifting and standing. Travel and social life are more difficult and they may be disabled from work. Personal care, sexual activity and sleeping are not grossly affected, and the patient can usually be managed by conservative means  41-60% Severe Disability Pain remains the main problem in this group, but activities of daily living are affected. These patients require a detailed investigation  61-80% Crippled Back pain impinges on all aspects of the patients life. Positive intervention is required  81-100% Bed-bound  These patients are either bed-bound or exaggerating their symptoms  Bluford FORBES [name], et al. Surgery versus conservative management of stable thoracolumbar fracture: the PRESTO feasibility RCT. [place] ([place]): [employer]; 2021 Nov. [facility] Technology Assessment, No. 25.62.) Appendix 3, Oswestry Disability Index category descriptors. Available from: Findjewelers.cz  Minimally Clinically Important Difference (MCID) = 12.8%  SCREENING FOR RED FLAGS: Bowel or bladder incontinence: No Spinal tumors: No Cauda equina syndrome: No Compression fracture: No Abdominal aneurysm: No  COGNITION: Overall cognitive status: Within functional limits for tasks assessed     SENSATION: WFL    POSTURE: decreased lumbar lordosis  PALPATION: Hypomobile grossly in thoracic and lumbar spine, tenderness mid thoracic and upper lumbar UPA; TTP bilateral but R>L paraspinals and QL, R glute tenderness; notes increased tenderness at R L5 paraspinals following testing  LUMBAR ROM: stretch with rotation bilateral  AROM eval 11/11  Flexion 0% limited WNL  Extension 0% limited WFL  Right lateral flexion 0% limited 10% limited   Left lateral flexion 25% limited* 25% limited  Right rotation 0% limited 25% limited  Left rotation 0% limited WFL   (Blank rows = not tested) * = pain/symptoms  LOWER EXTREMITY ROM:    WFL for tasks assessed  Active  Right  eval Left eval  Hip flexion    Hip extension    Hip abduction    Hip adduction    Hip internal rotation    Hip external rotation    Knee flexion    Knee extension    Ankle dorsiflexion    Ankle plantarflexion    Ankle inversion    Ankle eversion     (Blank rows = not tested) * = pain/symptoms  LOWER EXTREMITY MMT:    MMT Right eval Left eval Right 11/11 Left 11/11  Hip flexion 4+ 4+ 4/5 4+/5  Hip extension 4- 4- 4-/5 4+/5  Hip abduction 4- * 4- 3+/5 4-/5  Hip adduction      Hip internal rotation      Hip external rotation      Knee flexion 5 5 5/5 5/5  Knee extension 5 5 4/5 4/5  Ankle dorsiflexion 5 5    Ankle plantarflexion      Ankle inversion      Ankle eversion       (Blank rows = not tested) * = pain/symptoms    FUNCTIONAL TESTS:  5 times sit to stand: 9.57 seconds without UE support, mild L >R  Squat/lifting mechanics: fair mechanics  GAIT: Distance walked: 100 feet Assistive device utilized: None Level of assistance: Complete Independence Comments: Mild R compensated trendelenberg  TODAY'S TREATMENT:                                                                                                                              DATE:  04/16/24 Supine bridge with clams with GTB with TrA 2x12 Supine lumbar rotation 2x10 S/L open book x10 each Wall sits with UE diagonal with ball with TrA 2x10 Lateral band walks with TrA with GTB 2x10 Standing chops with GTB with TrA Supine piriformis stretch 2x30 sec bilat  STM and TPR to bilat lumbar paraspinals in prone.   04/08/24 Pt ambulated 3 laps in the clinic to warm up. Supine lumbar rotation 2x10 Supine bridge with clams with GTB with TrA 2x10 Supine dead bugs 2x10 Q-ped alt UE flexion with TrA x10 Q-ped alt LE extension with TrA x10 Q-ped birddogs x7 reps Standing chops with GTB with TrA 2x10 bilat Lateral band walks with TrA with GTB 3x10 Supine piriformis  stretch 2x30 sec bilat Attempted open book stretch, but stopped due to vertigo sx's.     04-15-2024 Supine dead bugs 2x10 Supine lumbar rotation 2x10 Supine bridge with clams with TrA 2x10 with GTB S/L open book 2x10 Supine piriformis stretch 2x30 sec bilat Standing paloff press with GTB 2x10 each Standing chops with RTB with TrA x10 each  STM and TPR to bilat lumbar paraspinals in prone.  03/24/24 Reviewed current function, pain levels, and response to prior treatment. Assessed lumbar AROM and LE strength.  See above. Standing paloff press with 5# x10 each Standing chops with RTB with TrA 2x10 each S/L open book exercise x  10 each   Pt completed Modified Oswestry Disability index.  See above  03/10/24 Reviewed pt presentation, pain level, and response to prior treatment.   Pt ambulated 3 laps in the clinic to warm up. Supine dead bug with feet unsupported 2x10 S/L open book exercise x 10 each Supine lumbar rotation 2x10 Standing paloff press with TrA with GTB 2x10 each Chops with RTB with TrA 2x 10  STM and TPR to bilat lumbar paraspinals in prone.    03/02/24 Pt ambulated 3 laps in the clinic to warm up. Supine dead bug with feet unsupported 2x10 Supine lumbar rotation 2x10   Assessed sx's with open book movement and the movement she did earlier this AM  S/L grade II-III neutral gapping joint mobs  x 2 repts in R S/L'ing  and x 3 reps in L S/Ling Re-assessed after Neutral gapping joint mobs  Supine piriformis stretch 2x30 sec bilat STM to bilat lumbar paraspinals in prone   02/24/24 Pt ambulated 3 laps in the clinic to warm up. Supine dead bug with feet unsupported x6,5 reps Supine SLR with contralateral UE extenion with TrA x10 each Q-ped alt UE flexion with TrA x 10 Q-ped alt LE extension with TrA x10 Standing paloff press with TrA with RTB 2x10 each Chops with RTB with TrA x 10 Lateral band walks with RTB with TrA 3x10  Manual Therapy:   STM and TPR to  bilat lumbar paraspinals and QL in prone       PATIENT EDUCATION:  Education details:  exercise form, HEP, POC, relevant anatomy, and rationale of interventions.  PT answered pt's questions. Person educated: Patient Education method: Explanation, Demonstration, verbal cues Education comprehension: verbalized understanding, returned demonstration, verbal cues required   HOME EXERCISE PROGRAM: Access Code: [email or web address] URL: [email or web address] Date: 12/12/2023 Prepared by: Prentice Zaunegger  Exercises - Supine Bridge  - 1 x daily - 7 x weekly - 2 sets - 10 reps - Supine Lower Trunk Rotation  - 2 x daily - 7 x weekly - 2 sets - 10 reps - Supine Transversus Abdominis Bracing - Hands on Stomach  - 2 x daily - 7 x weekly - 2 sets - 10 reps - 5 seconds hold - Supine March  - 1 x daily - 7 x weekly - 2 sets - 10 reps - Supine Core Control with Leg Extension  - 1 x daily - 7 x weekly - 2 sets - 10 reps - Hooklying Clamshell with Resistance  - 1 x daily - 5 x weekly - 2 sets - 10 reps - Standing Shoulder Row with Anchored Resistance  - 1 x daily - 4-5 x weekly - 2 sets - 10 reps - Modified Thomas Stretch  - 1 x daily - 7 x weekly - 2-3 reps - 20 seconds hold  ASSESSMENT:  CLINICAL IMPRESSION: Pt is progressing well with pain, sx's, and core strength and continues to have pain with twisting.  Pt performed exercises to improve core strength and spinal mobility including rotational movements.  Pt had good tolerance with exercises.  PT performed STM to improve soft tissue tightness and mobility and pain.  She responded well to treatment having no c/o's after treatment.  Pt sees MD or PA tomorrow.     OBJECTIVE IMPAIRMENTS: decreased activity tolerance, decreased balance, decreased endurance, decreased mobility, difficulty walking, decreased ROM, decreased strength, hypomobility, increased muscle spasms, impaired flexibility, improper body mechanics, postural dysfunction, and  pain  ACTIVITY LIMITATIONS: carrying, lifting, bending, standing, squatting,  stairs, transfers, bed mobility, reach over head, locomotion level, and caring for others  PARTICIPATION LIMITATIONS:  meal prep, cleaning, laundry, shopping, community activity, and yard work  PERSONAL FACTORS: Profession and Time since onset of injury/illness/exacerbation are also affecting patient's functional outcome.   REHAB POTENTIAL: Good  CLINICAL DECISION MAKING: Evolving/moderate complexity  EVALUATION COMPLEXITY: Moderate   GOALS: Goals reviewed with patient? Yes  SHORT TERM GOALS: Target date: 01/09/2024    Patient will be independent with HEP in order to improve functional outcomes. Baseline:  Goal status: GOAL MET  11/11  2.  Patient will report at least 25% improvement in symptoms for improved quality of life. Baseline:  Goal status: GOAL MET  11/11    LONG TERM GOALS: Target date: 03/10/2024    Patient will report at least 75% improvement in symptoms for improved quality of life. Baseline:  Goal status:  PROGRESSING   2.  Patient will improve Modified Oswestry score by at least 10 points in order to indicate improved tolerance to activity. Baseline: 12/50 Goal status: ONGOING  3.  Patient will demonstrate full lumbar ROM in all restricted planes for improved ability to move trunk while completing chores/work duties. Baseline:  Goal status: NOT MET  4.  Patient will be able to return to all activities unrestricted for improved ability to perform work functions and  exercise.  Baseline:  Goal status: ONGOING  5.  Patient will demonstrate grade of 5/5 MMT grade in all tested musculature as evidence of improved strength to assist with stair ambulation and gait.   Baseline:  Goal status: NOT MET   PLAN:  PT FREQUENCY: 1-2x/week  PT DURATION:  5 weeks   PLANNED INTERVENTIONS: 97164- PT Re-evaluation, 97110-Therapeutic exercises, 97530- Therapeutic activity, [id number]-  Neuromuscular re-education, 97535- Self Care, [id number]- Manual therapy, [id number]- Gait training, [phone]- Orthotic Fit/training, [phone]- Canalith repositioning, [id number]- Aquatic Therapy, [id number]- Splinting, [phone]- Wound care (first 20 sq cm), 97598- Wound care (each additional 20 sq cm)Patient/Family education, Balance training, Stair training, Taping, Dry Needling, Joint mobilization, Joint manipulation, Spinal manipulation, Spinal mobilization, Scar mobilization, and DME instructions.  PLAN FOR NEXT SESSION: f/u with HEP, hip and core strength, postural strength, spinal mobility, manual for pain/mobility.  Recert next visit.   Leigh Minerva III PT, DPT 04/18/24 4:22 PM                "

## 2024-04-20 ENCOUNTER — Ambulatory Visit (HOSPITAL_BASED_OUTPATIENT_CLINIC_OR_DEPARTMENT_OTHER): Payer: Worker's Compensation | Admitting: Physical Therapy

## 2024-04-21 ENCOUNTER — Ambulatory Visit (HOSPITAL_BASED_OUTPATIENT_CLINIC_OR_DEPARTMENT_OTHER): Payer: Self-pay | Admitting: Physical Therapy

## 2024-04-21 ENCOUNTER — Encounter (HOSPITAL_BASED_OUTPATIENT_CLINIC_OR_DEPARTMENT_OTHER): Payer: Self-pay | Admitting: Physical Therapy

## 2024-04-21 DIAGNOSIS — R29898 Other symptoms and signs involving the musculoskeletal system: Secondary | ICD-10-CM

## 2024-04-21 DIAGNOSIS — M6281 Muscle weakness (generalized): Secondary | ICD-10-CM

## 2024-04-21 DIAGNOSIS — M5459 Other low back pain: Secondary | ICD-10-CM | POA: Diagnosis not present

## 2024-04-21 NOTE — Therapy (Signed)
 " OUTPATIENT PHYSICAL THERAPY THORACOLUMBAR TREATMENT    Patient Name: Nicole Davila MRN: [id number] DOB:[birth year], 64 y.o., female Today's Date: 04/21/2024  END OF SESSION:  PT End of Session - 04/21/24 1224     Visit Number 20    Date for Recertification  04/21/24    Authorization Type Worker's Comp    PT Start Time 1154    PT Stop Time 1237    PT Time Calculation (min) 43 min    Activity Tolerance Patient tolerated treatment well    Behavior During Therapy WFL for tasks assessed/performed                   Past Medical History:  Diagnosis Date   Anxiety    Arthritis    Barrett esophagus    Chronic headaches    Depression    Elevated cholesterol    GERD (gastroesophageal reflux disease)    IBS (irritable bowel syndrome)    Thyroid  disease    UTI (urinary tract infection)    Past Surgical History:  Procedure Laterality Date   CESAREAN SECTION     TONSILLECTOMY     Patient Active Problem List   Diagnosis Date Noted   Pain in joint, ankle and foot 07/05/2016   Metatarsalgia of right foot 07/05/2016   Abnormality of gait 07/05/2016    PCP: Aisha Harvey, MD   REFERRING PROVIDER: Candance Jeoffrey SAILOR, PA-C  REFERRING DIAG: M54.50 (ICD-10-CM) - Low back pain, unspecified  Rationale for Evaluation and Treatment: Rehabilitation  THERAPY DIAG:  Other low back pain  Muscle weakness (generalized)  Other symptoms and signs involving the musculoskeletal system  ONSET DATE: June 2025  SUBJECTIVE:                                                                                                                                                                                           SUBJECTIVE STATEMENT: Pt states therapy has been helping.  Pt states she has been feeling better; she just has pain with twisting to the R.   Pt saw MD on 1/23 and she is still on light duty restrictions with no lifting > 10 lbs.  MD ordered a MRI and PT for 3-4 more weeks.  Pt  states she sat at her dining room table a lot yesterday which may be while she feels her back today.  Pt denies any adverse effects after prior treatment.   Pt returns to MD on 04/17/24.   Pt is a member at [employer].  PERTINENT HISTORY:  CAD, GERD Restriction of lifting no > 10 lbs per PA note  PAIN:  Are  you having pain? Yes: NPRS scale: 2/10 current, 5-6/10 worst, 0/10 best Pain location: R sided lumbar  Pain description: sharp Aggravating factors: standing 10-15 mins, sitting.  Worst pain is with twisting Relieving factors: heat, walking  PRECAUTIONS: None  WEIGHT BEARING RESTRICTIONS: No  FALLS:  Has patient fallen in last 6 months? No  OCCUPATION: librarian  PLOF: Independent  PATIENT GOALS: Wants to know what she can do, Back to feel better   OBJECTIVE: (objective measures from initial evaluation unless otherwise dated)  PATIENT SURVEYS:  Modified Oswestry:  MODIFIED OSWESTRY DISABILITY SCALE   Date: 12/12/23 Score 11/11  Pain intensity 1 = The pain is bad, but I can manage without having to take (1) I can stand as long as I want but, it increases my pain. pain medication. 0   2. Personal care (washing, dressing, etc.) 0 =  I can take care of myself normally without causing increased pain. 1  3. Lifting 4 = I can lift only very light weights 4  4. Walking 1 = Pain prevents me from walking more than 1 mile. 0  5. Sitting 3 =  Pain prevents me from sitting more than  hour. 2  6. Standing 1 =  I can stand as long as I want but, it increases my pain. 2  7. Sleeping 0 = Pain does not prevent me from sleeping well. 0  8. Social Life 0 = My social life is normal and does not increase my pain. 0  9. Traveling 1 =  I can travel anywhere, but it increases my pain. 1  10. Employment/ Homemaking 1 = My normal homemaking/job activities increase my pain, but I can still perform all that is required of me 1  Total 12/50 11/50 = 22%   Interpretation of scores: Score Category  Description  0-20% Minimal Disability The patient can cope with most living activities. Usually no treatment is indicated apart from advice on lifting, sitting and exercise  21-40% Moderate Disability The patient experiences more pain and difficulty with sitting, lifting and standing. Travel and social life are more difficult and they may be disabled from work. Personal care, sexual activity and sleeping are not grossly affected, and the patient can usually be managed by conservative means  41-60% Severe Disability Pain remains the main problem in this group, but activities of daily living are affected. These patients require a detailed investigation  61-80% Crippled Back pain impinges on all aspects of the patients life. Positive intervention is required  81-100% Bed-bound  These patients are either bed-bound or exaggerating their symptoms  Bluford FORBES [name], et al. Surgery versus conservative management of stable thoracolumbar fracture: the PRESTO feasibility RCT. [place] ([place]): [employer]; 2021 Nov. [facility] [employer] Assessment, No. 25.62.) Appendix 3, Oswestry Disability Index category descriptors. Available from: Findjewelers.cz  Minimally Clinically Important Difference (MCID) = 12.8%  SCREENING FOR RED FLAGS: Bowel or bladder incontinence: No Spinal tumors: No Cauda equina syndrome: No Compression fracture: No Abdominal aneurysm: No  COGNITION: Overall cognitive status: Within functional limits for tasks assessed     SENSATION: WFL    POSTURE: decreased lumbar lordosis  PALPATION: Hypomobile grossly in thoracic and lumbar spine, tenderness mid thoracic and upper lumbar UPA; TTP bilateral but R>L paraspinals and QL, R glute tenderness; notes increased tenderness at R L5 paraspinals following testing  LUMBAR ROM: stretch with rotation bilateral  AROM eval 11/11  Flexion 0% limited WNL  Extension 0% limited WFL  Right  lateral flexion 0% limited 10% limited   Left lateral flexion 25% limited* 25% limited  Right rotation 0% limited 25% limited  Left rotation 0% limited WFL   (Blank rows = not tested) * = pain/symptoms  LOWER EXTREMITY ROM:   WFL for tasks assessed  Active  Right eval Left eval  Hip flexion    Hip extension    Hip abduction    Hip adduction    Hip internal rotation    Hip external rotation    Knee flexion    Knee extension    Ankle dorsiflexion    Ankle plantarflexion    Ankle inversion    Ankle eversion     (Blank rows = not tested) * = pain/symptoms  LOWER EXTREMITY MMT:    MMT Right eval Left eval Right 11/11 Left 11/11  Hip flexion 4+ 4+ 4/5 4+/5  Hip extension 4- 4- 4-/5 4+/5  Hip abduction 4- * 4- 3+/5 4-/5  Hip adduction      Hip internal rotation      Hip external rotation      Knee flexion 5 5 5/5 5/5  Knee extension 5 5 4/5 4/5  Ankle dorsiflexion 5 5    Ankle plantarflexion      Ankle inversion      Ankle eversion       (Blank rows = not tested) * = pain/symptoms    FUNCTIONAL TESTS:  5 times sit to stand: 9.57 seconds without UE support, mild L >R  Squat/lifting mechanics: fair mechanics  GAIT: Distance walked: 100 feet Assistive device utilized: None Level of assistance: Complete Independence Comments: Mild R compensated trendelenberg  TODAY'S TREATMENT:                                                                                                                              DATE:  04/21/24 Reviewed pt presentation, pain level, and response to prior treatment.  Supine bridge with clams with GTB with TrA 2x12 Supine lumbar rotation x10, with LE's on p-ball 2x10 S/L open book x10 each Wall sits with UE diagonal with ball with TrA 2x10 Lateral band walks with TrA 3x10 with GTB above knees Standing chops with GTB with TrA 2x10 bilat  Qped birddogs 2x10   04/16/24 Supine bridge with clams with GTB with TrA 2x12 Supine lumbar rotation  2x10 S/L open book x10 each Wall sits with UE diagonal with ball with TrA 2x10 Lateral band walks with TrA with GTB 2x10 Standing chops with GTB with TrA Supine piriformis stretch 2x30 sec bilat  STM and TPR to bilat lumbar paraspinals in prone.   04/08/24 Pt ambulated 3 laps in the clinic to warm up. Supine lumbar rotation 2x10 Supine bridge with clams with GTB with TrA 2x10 Supine dead bugs 2x10 Q-ped alt UE flexion with TrA x10 Q-ped alt LE extension with TrA x10 Q-ped birddogs x7 reps Standing chops with GTB with TrA 2x10 bilat Lateral  band walks with TrA with GTB 3x10 Supine piriformis stretch 2x30 sec bilat Attempted open book stretch, but stopped due to vertigo sx's.     2024/04/12 Supine dead bugs 2x10 Supine lumbar rotation 2x10 Supine bridge with clams with TrA 2x10 with GTB S/L open book 2x10 Supine piriformis stretch 2x30 sec bilat Standing paloff press with GTB 2x10 each Standing chops with RTB with TrA x10 each  STM and TPR to bilat lumbar paraspinals in prone.  03/24/24 Reviewed current function, pain levels, and response to prior treatment. Assessed lumbar AROM and LE strength.  See above. Standing paloff press with 5# x10 each Standing chops with RTB with TrA 2x10 each S/L open book exercise x 10 each   Pt completed Modified Oswestry Disability index.  See above  03/10/24 Reviewed pt presentation, pain level, and response to prior treatment.   Pt ambulated 3 laps in the clinic to warm up. Supine dead bug with feet unsupported 2x10 S/L open book exercise x 10 each Supine lumbar rotation 2x10 Standing paloff press with TrA with GTB 2x10 each Chops with RTB with TrA 2x 10  STM and TPR to bilat lumbar paraspinals in prone.    03/02/24 Pt ambulated 3 laps in the clinic to warm up. Supine dead bug with feet unsupported 2x10 Supine lumbar rotation 2x10   Assessed sx's with open book movement and the movement she did earlier this AM  S/L grade  II-III neutral gapping joint mobs  x 2 repts in R S/L'ing  and x 3 reps in L S/Ling Re-assessed after Neutral gapping joint mobs  Supine piriformis stretch 2x30 sec bilat STM to bilat lumbar paraspinals in prone   02/24/24 Pt ambulated 3 laps in the clinic to warm up. Supine dead bug with feet unsupported x6,5 reps Supine SLR with contralateral UE extenion with TrA x10 each Q-ped alt UE flexion with TrA x 10 Q-ped alt LE extension with TrA x10 Standing paloff press with TrA with RTB 2x10 each Chops with RTB with TrA x 10 Lateral band walks with RTB with TrA 3x10  Manual Therapy:   STM and TPR to bilat lumbar paraspinals and QL in prone       PATIENT EDUCATION:  Education details:  exercise form, HEP, POC, relevant anatomy, and rationale of interventions.  PT answered pt's questions. Person educated: Patient Education method: Explanation, Demonstration, verbal cues Education comprehension: verbalized understanding, returned demonstration, verbal cues required   HOME EXERCISE PROGRAM: Access Code: [email or web address] URL: [email or web address] Date: 12/12/2023 Prepared by: Prentice Zaunegger  Exercises - Supine Bridge  - 1 x daily - 7 x weekly - 2 sets - 10 reps - Supine Lower Trunk Rotation  - 2 x daily - 7 x weekly - 2 sets - 10 reps - Supine Transversus Abdominis Bracing - Hands on Stomach  - 2 x daily - 7 x weekly - 2 sets - 10 reps - 5 seconds hold - Supine March  - 1 x daily - 7 x weekly - 2 sets - 10 reps - Supine Core Control with Leg Extension  - 1 x daily - 7 x weekly - 2 sets - 10 reps - Hooklying Clamshell with Resistance  - 1 x daily - 5 x weekly - 2 sets - 10 reps - Standing Shoulder Row with Anchored Resistance  - 1 x daily - 4-5 x weekly - 2 sets - 10 reps - Modified Thomas Stretch  - 1 x daily - 7 x weekly - 2-3 reps -  20 seconds hold  ASSESSMENT:  CLINICAL IMPRESSION: Pt is making good progress with pain and sx's and feeling better.  She continues to  have pain with twisting.  Pt saw MD who ordered 3-4 more weeks of PT.  She performed exercises to improve core and LE strength and spinal mobility including rotational movements.  Pt tolerated exercises well.  She demonstrates improved core strength as evidenced by performance of q-ped birddogs.  Pt able to perform 2 sets of 10 reps of the Q-ped birddogs.  Pt responded well to treatment reporting improved pain in lumbar though does have 2/10 pain R glute.  Pt states the pain in her glute may be from the birddogs.      OBJECTIVE IMPAIRMENTS: decreased activity tolerance, decreased balance, decreased endurance, decreased mobility, difficulty walking, decreased ROM, decreased strength, hypomobility, increased muscle spasms, impaired flexibility, improper body mechanics, postural dysfunction, and pain  ACTIVITY LIMITATIONS: carrying, lifting, bending, standing, squatting, stairs, transfers, bed mobility, reach over head, locomotion level, and caring for others  PARTICIPATION LIMITATIONS:  meal prep, cleaning, laundry, shopping, community activity, and yard work  PERSONAL FACTORS: Profession and Time since onset of injury/illness/exacerbation are also affecting patient's functional outcome.   REHAB POTENTIAL: Good  CLINICAL DECISION MAKING: Evolving/moderate complexity  EVALUATION COMPLEXITY: Moderate   GOALS: Goals reviewed with patient? Yes  SHORT TERM GOALS: Target date: 01/09/2024    Patient will be independent with HEP in order to improve functional outcomes. Baseline:  Goal status: GOAL MET  11/11  2.  Patient will report at least 25% improvement in symptoms for improved quality of life. Baseline:  Goal status: GOAL MET  11/11    LONG TERM GOALS: Target date: 03/10/2024    Patient will report at least 75% improvement in symptoms for improved quality of life. Baseline:  Goal status:  PROGRESSING   2.  Patient will improve Modified Oswestry score by at least 10 points in  order to indicate improved tolerance to activity. Baseline: 12/50 Goal status: ONGOING  3.  Patient will demonstrate full lumbar ROM in all restricted planes for improved ability to move trunk while completing chores/work duties. Baseline:  Goal status: NOT MET  4.  Patient will be able to return to all activities unrestricted for improved ability to perform work functions and  exercise.  Baseline:  Goal status: ONGOING  5.  Patient will demonstrate grade of 5/5 MMT grade in all tested musculature as evidence of improved strength to assist with stair ambulation and gait.   Baseline:  Goal status: NOT MET   PLAN:  PT FREQUENCY: 1-2x/week  PT DURATION:  5 weeks   PLANNED INTERVENTIONS: 97164- PT Re-evaluation, 97110-Therapeutic exercises, 97530- Therapeutic activity, [id number]- Neuromuscular re-education, 97535- Self Care, [id number]- Manual therapy, [id number]- Gait training, [phone]- Orthotic Fit/training, [phone]- Canalith repositioning, [id number]- Aquatic Therapy, [phone]- Splinting, [phone]- Wound care (first 20 sq cm), 97598- Wound care (each additional 20 sq cm)Patient/Family education, Balance training, Stair training, Taping, Dry Needling, Joint mobilization, Joint manipulation, Spinal manipulation, Spinal mobilization, Scar mobilization, and DME instructions.  PLAN FOR NEXT SESSION: f/u with HEP, hip and core strength, postural strength, spinal mobility, manual for pain/mobility.  Recert next visit.   Leigh Minerva III PT, DPT 04/21/24 12:52 PM                 "

## 2024-04-28 ENCOUNTER — Encounter (HOSPITAL_BASED_OUTPATIENT_CLINIC_OR_DEPARTMENT_OTHER): Payer: Self-pay | Admitting: Physical Therapy

## 2024-05-06 ENCOUNTER — Encounter (HOSPITAL_BASED_OUTPATIENT_CLINIC_OR_DEPARTMENT_OTHER): Payer: Self-pay | Admitting: Physical Therapy

## 2024-05-13 ENCOUNTER — Ambulatory Visit

## 2024-05-22 ENCOUNTER — Encounter: Admitting: Internal Medicine

## 2024-05-27 ENCOUNTER — Encounter (HOSPITAL_BASED_OUTPATIENT_CLINIC_OR_DEPARTMENT_OTHER): Payer: Self-pay | Admitting: Physical Therapy
